# Patient Record
Sex: Female | Born: 1935 | Race: White | Hispanic: No | Marital: Married | Smoking: Former smoker
Health system: Southern US, Community
[De-identification: ages and names within clinical notes are randomized; demographics above are authoritative.]

## PROBLEM LIST (undated history)

## (undated) DIAGNOSIS — M1990 Unspecified osteoarthritis, unspecified site: Secondary | ICD-10-CM

## (undated) DIAGNOSIS — J449 Chronic obstructive pulmonary disease, unspecified: Secondary | ICD-10-CM

## (undated) DIAGNOSIS — I10 Essential (primary) hypertension: Secondary | ICD-10-CM

## (undated) DIAGNOSIS — F329 Major depressive disorder, single episode, unspecified: Secondary | ICD-10-CM

## (undated) DIAGNOSIS — N3281 Overactive bladder: Secondary | ICD-10-CM

## (undated) DIAGNOSIS — F32A Depression, unspecified: Secondary | ICD-10-CM

## (undated) DIAGNOSIS — Z955 Presence of coronary angioplasty implant and graft: Secondary | ICD-10-CM

## (undated) DIAGNOSIS — E7800 Pure hypercholesterolemia, unspecified: Secondary | ICD-10-CM

## (undated) HISTORY — PX: ABDOMINAL AORTIC ANEURYSM REPAIR: SUR1152

## (undated) HISTORY — PX: ABDOMINAL HYSTERECTOMY: SHX81

## (undated) HISTORY — PX: BREAST EXCISIONAL BIOPSY: SUR124

## (undated) HISTORY — PX: NECK SURGERY: SHX720

## (undated) HISTORY — PX: APPENDECTOMY: SHX54

## (undated) HISTORY — PX: TONSILLECTOMY: SUR1361

## (undated) HISTORY — DX: Depression, unspecified: F32.A

## (undated) HISTORY — PX: FOOT SURGERY: SHX648

## (undated) HISTORY — DX: Chronic obstructive pulmonary disease, unspecified: J44.9

## (undated) HISTORY — PX: BREAST SURGERY: SHX581

## (undated) HISTORY — PX: CHOLECYSTECTOMY: SHX55

## (undated) HISTORY — DX: Major depressive disorder, single episode, unspecified: F32.9

---

## 2004-11-10 ENCOUNTER — Ambulatory Visit: Payer: Self-pay | Admitting: *Deleted

## 2004-11-10 ENCOUNTER — Inpatient Hospital Stay (HOSPITAL_COMMUNITY): Admission: AD | Admit: 2004-11-10 | Discharge: 2004-11-11 | Payer: Self-pay | Admitting: *Deleted

## 2013-06-22 DIAGNOSIS — M47812 Spondylosis without myelopathy or radiculopathy, cervical region: Secondary | ICD-10-CM

## 2013-06-22 HISTORY — DX: Spondylosis without myelopathy or radiculopathy, cervical region: M47.812

## 2013-11-18 DIAGNOSIS — M47814 Spondylosis without myelopathy or radiculopathy, thoracic region: Secondary | ICD-10-CM

## 2013-11-18 DIAGNOSIS — M5417 Radiculopathy, lumbosacral region: Secondary | ICD-10-CM

## 2013-11-18 DIAGNOSIS — M5414 Radiculopathy, thoracic region: Secondary | ICD-10-CM | POA: Insufficient documentation

## 2013-11-18 DIAGNOSIS — R519 Headache, unspecified: Secondary | ICD-10-CM | POA: Insufficient documentation

## 2013-11-18 DIAGNOSIS — R51 Headache: Secondary | ICD-10-CM

## 2013-11-18 DIAGNOSIS — IMO0002 Reserved for concepts with insufficient information to code with codable children: Secondary | ICD-10-CM | POA: Insufficient documentation

## 2013-11-18 HISTORY — DX: Spondylosis without myelopathy or radiculopathy, thoracic region: M47.814

## 2013-11-18 HISTORY — DX: Headache, unspecified: R51.9

## 2013-11-18 HISTORY — DX: Radiculopathy, thoracic region: M54.14

## 2014-01-01 DIAGNOSIS — M5417 Radiculopathy, lumbosacral region: Secondary | ICD-10-CM | POA: Insufficient documentation

## 2014-01-01 HISTORY — DX: Radiculopathy, lumbosacral region: M54.17

## 2014-04-30 ENCOUNTER — Other Ambulatory Visit: Payer: Self-pay | Admitting: Neurosurgery

## 2014-04-30 DIAGNOSIS — M4714 Other spondylosis with myelopathy, thoracic region: Secondary | ICD-10-CM

## 2014-04-30 DIAGNOSIS — M4310 Spondylolisthesis, site unspecified: Secondary | ICD-10-CM

## 2014-05-06 ENCOUNTER — Ambulatory Visit
Admission: RE | Admit: 2014-05-06 | Discharge: 2014-05-06 | Disposition: A | Discharge status: Home / Self Care | Payer: Medicare Other | Source: Ambulatory Visit | Attending: Neurosurgery | Admitting: Neurosurgery

## 2014-05-06 VITALS — BP 117/58 | HR 69

## 2014-05-06 DIAGNOSIS — M4310 Spondylolisthesis, site unspecified: Secondary | ICD-10-CM

## 2014-05-06 DIAGNOSIS — M4714 Other spondylosis with myelopathy, thoracic region: Secondary | ICD-10-CM

## 2014-05-06 DIAGNOSIS — M5414 Radiculopathy, thoracic region: Secondary | ICD-10-CM

## 2014-05-06 DIAGNOSIS — M47814 Spondylosis without myelopathy or radiculopathy, thoracic region: Secondary | ICD-10-CM

## 2014-05-06 DIAGNOSIS — M5417 Radiculopathy, lumbosacral region: Secondary | ICD-10-CM

## 2014-05-06 MED ORDER — ONDANSETRON HCL 4 MG/2ML IJ SOLN
4.0000 mg | Freq: Once | INTRAMUSCULAR | Status: AC
  Administered 2014-05-06: 4 mg via INTRAMUSCULAR

## 2014-05-06 MED ORDER — MEPERIDINE HCL 100 MG/ML IJ SOLN
75.0000 mg | Freq: Once | INTRAMUSCULAR | Status: AC
  Administered 2014-05-06: 75 mg via INTRAMUSCULAR

## 2014-05-06 MED ORDER — IOHEXOL 300 MG/ML  SOLN
10.0000 mL | Freq: Once | INTRAMUSCULAR | Status: AC | PRN
  Administered 2014-05-06: 10 mL via INTRATHECAL

## 2014-05-06 MED ORDER — DIAZEPAM 5 MG PO TABS
5.0000 mg | ORAL_TABLET | Freq: Once | ORAL | Status: AC
  Administered 2014-05-06: 5 mg via ORAL

## 2014-05-06 NOTE — Progress Notes (Signed)
Husband at bedside.  Patient resting quietly without complaint (other than a dry mouth) after myelogram.  jkl

## 2014-05-06 NOTE — Discharge Instructions (Signed)
Myelogram Discharge Instructions  1. Go home and rest quietly for the next 24 hours.  It is important to lie flat for the next 24 hours.  Get up only to go to the restroom.  You may lie in the bed or on a couch on your back, your stomach, your left side or your right side.  You may have one pillow under your head.  You may have pillows between your knees while you are on your side or under your knees while you are on your back.  2. DO NOT drive today.  Recline the seat as far back as it will go, while still wearing your seat belt, on the way home.  3. You may get up to go to the bathroom as needed.  You may sit up for 10 minutes to eat.  You may resume your normal diet and medications unless otherwise indicated.  Drink plenty of extra fluids today and tomorrow.  4. The incidence of a spinal headache with nausea and/or vomiting is about 5% (one in 20 patients).  If you develop a headache, lie flat and drink plenty of fluids until the headache goes away.  Caffeinated beverages may be helpful.  If you develop severe nausea and vomiting or a headache that does not go away with flat bed rest, call [phone].  5. You may resume normal activities after your 24 hours of bed rest is over; however, do not exert yourself strongly or do any heavy lifting tomorrow.  6. Call your physician for a follow-up appointment.   You may resume Tramadol on Friday, May 07, 2014 after 8:30a.m.

## 2014-05-06 NOTE — Progress Notes (Signed)
Patient states she has been off Tramadol for at least the past two days.  jkl 

## 2014-05-12 ENCOUNTER — Other Ambulatory Visit: Payer: Self-pay | Admitting: Neurosurgery

## 2014-05-12 DIAGNOSIS — M5126 Other intervertebral disc displacement, lumbar region: Secondary | ICD-10-CM

## 2014-05-17 ENCOUNTER — Other Ambulatory Visit: Payer: Medicare Other

## 2014-05-31 ENCOUNTER — Other Ambulatory Visit: Payer: Self-pay | Admitting: Neurosurgery

## 2014-05-31 ENCOUNTER — Ambulatory Visit
Admission: RE | Admit: 2014-05-31 | Discharge: 2014-05-31 | Disposition: A | Discharge status: Home / Self Care | Payer: Medicare Other | Source: Ambulatory Visit | Attending: Neurosurgery | Admitting: Neurosurgery

## 2014-05-31 DIAGNOSIS — M5126 Other intervertebral disc displacement, lumbar region: Secondary | ICD-10-CM

## 2014-05-31 MED ORDER — IOHEXOL 180 MG/ML  SOLN
1.0000 mL | Freq: Once | INTRAMUSCULAR | Status: AC | PRN
  Administered 2014-05-31: 1 mL via EPIDURAL

## 2014-05-31 MED ORDER — METHYLPREDNISOLONE ACETATE 40 MG/ML INJ SUSP (RADIOLOG
120.0000 mg | Freq: Once | INTRAMUSCULAR | Status: AC
  Administered 2014-05-31: 120 mg via EPIDURAL

## 2014-05-31 NOTE — Discharge Instructions (Signed)

## 2014-11-16 ENCOUNTER — Other Ambulatory Visit: Payer: Self-pay | Admitting: Neurosurgery

## 2014-11-16 DIAGNOSIS — M5126 Other intervertebral disc displacement, lumbar region: Secondary | ICD-10-CM

## 2014-12-14 ENCOUNTER — Other Ambulatory Visit: Payer: Self-pay

## 2014-12-14 ENCOUNTER — Ambulatory Visit
Admission: RE | Admit: 2014-12-14 | Discharge: 2014-12-14 | Disposition: A | Discharge status: Home / Self Care | Payer: Medicare Other | Source: Ambulatory Visit | Attending: Neurosurgery | Admitting: Neurosurgery

## 2014-12-14 DIAGNOSIS — M5126 Other intervertebral disc displacement, lumbar region: Secondary | ICD-10-CM

## 2014-12-14 MED ORDER — IOHEXOL 180 MG/ML  SOLN
1.0000 mL | Freq: Once | INTRAMUSCULAR | Status: AC | PRN
  Administered 2014-12-14: 1 mL via EPIDURAL

## 2014-12-14 MED ORDER — METHYLPREDNISOLONE ACETATE 40 MG/ML INJ SUSP (RADIOLOG
120.0000 mg | Freq: Once | INTRAMUSCULAR | Status: AC
  Administered 2014-12-14: 120 mg via EPIDURAL

## 2014-12-14 NOTE — Discharge Instructions (Signed)

## 2014-12-15 ENCOUNTER — Other Ambulatory Visit: Payer: Self-pay

## 2015-01-05 ENCOUNTER — Other Ambulatory Visit: Payer: Self-pay | Admitting: Neurosurgery

## 2015-01-05 DIAGNOSIS — M4807 Spinal stenosis, lumbosacral region: Secondary | ICD-10-CM

## 2015-01-11 ENCOUNTER — Ambulatory Visit
Admission: RE | Admit: 2015-01-11 | Discharge: 2015-01-11 | Disposition: A | Discharge status: Home / Self Care | Payer: Medicare Other | Source: Ambulatory Visit | Attending: Neurosurgery | Admitting: Neurosurgery

## 2015-01-11 DIAGNOSIS — M4807 Spinal stenosis, lumbosacral region: Secondary | ICD-10-CM

## 2015-01-11 MED ORDER — IOHEXOL 180 MG/ML  SOLN
1.0000 mL | Freq: Once | INTRAMUSCULAR | Status: AC | PRN
  Administered 2015-01-11: 1 mL via EPIDURAL

## 2015-01-11 MED ORDER — METHYLPREDNISOLONE ACETATE 40 MG/ML INJ SUSP (RADIOLOG
120.0000 mg | Freq: Once | INTRAMUSCULAR | Status: AC
  Administered 2015-01-11: 120 mg via EPIDURAL

## 2015-01-11 NOTE — Discharge Instructions (Signed)

## 2015-10-06 DIAGNOSIS — Z862 Personal history of diseases of the blood and blood-forming organs and certain disorders involving the immune mechanism: Secondary | ICD-10-CM | POA: Insufficient documentation

## 2015-10-06 DIAGNOSIS — I739 Peripheral vascular disease, unspecified: Secondary | ICD-10-CM

## 2015-10-06 DIAGNOSIS — D509 Iron deficiency anemia, unspecified: Secondary | ICD-10-CM | POA: Insufficient documentation

## 2015-10-06 DIAGNOSIS — K219 Gastro-esophageal reflux disease without esophagitis: Secondary | ICD-10-CM

## 2015-10-06 DIAGNOSIS — I714 Abdominal aortic aneurysm, without rupture, unspecified: Secondary | ICD-10-CM | POA: Insufficient documentation

## 2015-10-06 DIAGNOSIS — Z95828 Presence of other vascular implants and grafts: Secondary | ICD-10-CM

## 2015-10-06 DIAGNOSIS — J439 Emphysema, unspecified: Secondary | ICD-10-CM

## 2015-10-06 DIAGNOSIS — R0989 Other specified symptoms and signs involving the circulatory and respiratory systems: Secondary | ICD-10-CM | POA: Insufficient documentation

## 2015-10-06 DIAGNOSIS — I2510 Atherosclerotic heart disease of native coronary artery without angina pectoris: Secondary | ICD-10-CM | POA: Insufficient documentation

## 2015-10-06 DIAGNOSIS — I10 Essential (primary) hypertension: Secondary | ICD-10-CM | POA: Insufficient documentation

## 2015-10-06 DIAGNOSIS — G8381 Brown-Sequard syndrome: Secondary | ICD-10-CM | POA: Insufficient documentation

## 2015-10-06 DIAGNOSIS — Z8679 Personal history of other diseases of the circulatory system: Secondary | ICD-10-CM

## 2015-10-06 DIAGNOSIS — E785 Hyperlipidemia, unspecified: Secondary | ICD-10-CM | POA: Insufficient documentation

## 2015-10-06 HISTORY — DX: Abdominal aortic aneurysm, without rupture, unspecified: I71.40

## 2015-10-06 HISTORY — DX: Emphysema, unspecified: J43.9

## 2015-10-06 HISTORY — DX: Personal history of other diseases of the circulatory system: Z86.79

## 2015-10-06 HISTORY — DX: Atherosclerotic heart disease of native coronary artery without angina pectoris: I25.10

## 2015-10-06 HISTORY — DX: Iron deficiency anemia, unspecified: D50.9

## 2015-10-06 HISTORY — DX: Personal history of diseases of the blood and blood-forming organs and certain disorders involving the immune mechanism: Z86.2

## 2015-10-06 HISTORY — DX: Peripheral vascular disease, unspecified: I73.9

## 2015-10-06 HISTORY — DX: Other specified symptoms and signs involving the circulatory and respiratory systems: R09.89

## 2015-10-06 HISTORY — DX: Essential (primary) hypertension: I10

## 2015-10-06 HISTORY — DX: Brown-Sequard syndrome: G83.81

## 2015-10-06 HISTORY — DX: Gastro-esophageal reflux disease without esophagitis: K21.9

## 2015-10-06 HISTORY — DX: Abdominal aortic aneurysm, without rupture: I71.4

## 2015-10-06 HISTORY — DX: Presence of other vascular implants and grafts: Z95.828

## 2015-10-25 DIAGNOSIS — E785 Hyperlipidemia, unspecified: Secondary | ICD-10-CM

## 2015-10-25 HISTORY — DX: Hyperlipidemia, unspecified: E78.5

## 2015-11-03 ENCOUNTER — Encounter (HOSPITAL_COMMUNITY): Payer: Self-pay | Admitting: *Deleted

## 2015-11-03 ENCOUNTER — Emergency Department (HOSPITAL_COMMUNITY)
Admission: EM | Admit: 2015-11-03 | Discharge: 2015-11-03 | Disposition: A | Discharge status: Home / Self Care | Payer: Medicare PPO | Attending: Emergency Medicine | Admitting: Emergency Medicine

## 2015-11-03 DIAGNOSIS — M25512 Pain in left shoulder: Secondary | ICD-10-CM | POA: Diagnosis not present

## 2015-11-03 DIAGNOSIS — M546 Pain in thoracic spine: Secondary | ICD-10-CM | POA: Diagnosis not present

## 2015-11-03 DIAGNOSIS — Z880 Allergy status to penicillin: Secondary | ICD-10-CM | POA: Insufficient documentation

## 2015-11-03 DIAGNOSIS — Z7982 Long term (current) use of aspirin: Secondary | ICD-10-CM | POA: Diagnosis not present

## 2015-11-03 DIAGNOSIS — Z79899 Other long term (current) drug therapy: Secondary | ICD-10-CM | POA: Diagnosis not present

## 2015-11-03 DIAGNOSIS — I10 Essential (primary) hypertension: Secondary | ICD-10-CM | POA: Insufficient documentation

## 2015-11-03 DIAGNOSIS — E7800 Pure hypercholesterolemia, unspecified: Secondary | ICD-10-CM | POA: Diagnosis not present

## 2015-11-03 DIAGNOSIS — Z87448 Personal history of other diseases of urinary system: Secondary | ICD-10-CM | POA: Diagnosis not present

## 2015-11-03 DIAGNOSIS — Z955 Presence of coronary angioplasty implant and graft: Secondary | ICD-10-CM | POA: Diagnosis not present

## 2015-11-03 DIAGNOSIS — Z87891 Personal history of nicotine dependence: Secondary | ICD-10-CM | POA: Diagnosis not present

## 2015-11-03 DIAGNOSIS — M542 Cervicalgia: Secondary | ICD-10-CM

## 2015-11-03 DIAGNOSIS — M1990 Unspecified osteoarthritis, unspecified site: Secondary | ICD-10-CM | POA: Diagnosis not present

## 2015-11-03 HISTORY — DX: Unspecified osteoarthritis, unspecified site: M19.90

## 2015-11-03 HISTORY — DX: Essential (primary) hypertension: I10

## 2015-11-03 HISTORY — DX: Overactive bladder: N32.81

## 2015-11-03 HISTORY — DX: Presence of coronary angioplasty implant and graft: Z95.5

## 2015-11-03 HISTORY — DX: Pure hypercholesterolemia, unspecified: E78.00

## 2015-11-03 LAB — I-STAT TROPONIN, ED: Troponin i, poc: 0 ng/mL (ref 0.00–0.08)

## 2015-11-03 MED ORDER — FENTANYL CITRATE (PF) 100 MCG/2ML IJ SOLN
50.0000 ug | Freq: Once | INTRAMUSCULAR | Status: AC
  Administered 2015-11-03: 50 ug via INTRAMUSCULAR
  Filled 2015-11-03: qty 2

## 2015-11-03 NOTE — ED Notes (Signed)
Pt c/o L shoulder pain onset x 5 days denies injury, pt moves L arm with pain, +PS, no swelling noted, A&O x4

## 2015-11-03 NOTE — ED Provider Notes (Signed)
CSN: [id number]     Arrival date & time 11/03/15  1322 History  By signing my name below, I, Doreatha Martinva Mathews, attest that this documentation has been prepared under the direction and in the presence of Newell RubbermaidJeffrey Kate Larock, PA-C. Electronically Signed: Doreatha MartinEva Mathews, ED Scribe. 11/03/2015. 3:25 PM.     Chief Complaint  Patient presents with  . Shoulder Pain   The history is provided by the patient. No language interpreter was used.   HPI Comments:   Kristi BonBarbara P Gelpi is a 80 y.o. female with h/o HTN, cardiac stent, bulging disc on PRN 5-325 mg hydrocodone-acetaminophen who presents to the Emergency Department complaining of moderate, gradually worsening left shoulder pain onset 4 days ago. Pt notes that her pain began in her neck and has progressed to her shoulder and left upper back. She reports she took 5-325 hydrocodone-acetaminophen at 9am today with no relief of pain. Pt notes that per pain is exacerbated by movement and pressure, unaffected by inspiration and expiration. She notes h/o similar pain of the right shoulder that did not extend as far down her shoulder and back. Pt states that she was operated on by Dr. Deneen HartsHussey with relief of the pain of her right shoulder. Pt states her current pain does not feel similar to prior MI. Pt was diagnosed with Pneumonia 4 days ago by her PCP and notes that her cough has been improving. No h/o CA. No recent significant weight loss. Pt notes that most recently, she has been followed by Dr. Glee ArvinKram with orthopedic surgery for her back pain.  She denies CP, SOB, abdominal pain, pain radiation down her arm.     Past Medical History  Diagnosis Date  . Hypertension   . Arthritis   . Overactive bladder   . Hypercholesteremia   . Hx of heart artery stent    Past Surgical History  Procedure Laterality Date  . Abdominal hysterectomy    . Cholecystectomy    . Neck surgery    . Abdominal aortic aneurysm repair    . Foot surgery Left    No family history on  file. Social History  Substance Use Topics  . Smoking status: Former Smoker -- 1.00 packs/day for 40 years    Types: Cigarettes    Quit date: 12/13/2005  . Smokeless tobacco: Never Used  . Alcohol Use: No   OB History    No data available     Review of Systems  All other systems reviewed and are negative.  Allergies  Amoxicillin and Sulfa antibiotics  Home Medications   Prior to Admission medications   Medication Sig Start Date End Date Taking? Authorizing Provider  amLODipine (NORVASC) 5 MG tablet Frequency:   Dosage:0   MG  Instructions:  Note:1 tablet po daily. 06/22/13   Historical Provider, MD  aspirin EC 325 MG tablet Frequency:   Dosage:0   MG  Instructions:  Note:1 tablet po daily. 06/22/13   Historical Provider, MD  atorvastatin (LIPITOR) 80 MG tablet 1 mg. 06/22/13   Historical Provider, MD  baclofen (LIORESAL) 10 MG tablet Frequency:   Dosage:0   MG  Instructions:  Note:1 tablet po q 12hrs as needed for major pain. 06/22/13   Historical Provider, MD  Calcium Carb-Cholecalciferol (SM CALCIUM/VITAMIN D) 600-800 MG-UNIT TABS Frequency:   Dosage:0     Instructions:  Note:1 tablet po BID. 06/22/13   Historical Provider, MD  Cholecalciferol (D 2000) 2000 UNITS TABS Frequency:   Dosage:0   UNIT  Instructions:  Note:1  tablet po daily. 06/22/13   Historical Provider, MD  esomeprazole (NEXIUM) 40 MG capsule Frequency:   Dosage:0   MG  Instructions:  Note:TAKE ONE CAPSULE BY MOUTH ONCE DAILY 06/22/13   Historical Provider, MD  ezetimibe (ZETIA) 10 MG tablet Frequency:   Dosage:0   MG  Instructions:  Note:1 tablet po daily. 06/22/13   Historical Provider, MD  Glucosamine 750 MG TABS Frequency:   Dosage:0   MG  Instructions:  Note:2 tablets po daily. 06/22/13   Historical Provider, MD  metoprolol (LOPRESSOR) 50 MG tablet Frequency:   Dosage:0   MG  Instructions:  Note:1 tablet po daily. 06/22/13   Historical Provider, MD  pregabalin (LYRICA) 50 MG capsule 50 mg. 11/18/13   Historical  Provider, MD  ranitidine (ZANTAC) 300 MG capsule Frequency:   Dosage:0.0     Instructions:  Note: 06/22/13   Historical Provider, MD  tiotropium (SPIRIVA HANDIHALER) 18 MCG inhalation capsule Frequency:   Dosage:0   MCG  Instructions:  Note:USE 1 CAPSULE IN HANDIHALER ONCE DAILY 06/22/13   Historical Provider, MD  traMADol (ULTRAM) 50 MG tablet 4 mg. 09/15/13   Historical Provider, MD   BP 130/60 mmHg  Pulse 83  Temp(Src) 98.1 F (36.7 C) (Oral)  Resp 18  Ht  (1.575 m)  Wt 58.174 kg  BMI 23.45 kg/m2  SpO2 98% Physical Exam  Constitutional: She is oriented to person, place, and time. She appears well-developed and well-nourished.  HENT:  Head: Normocephalic and atraumatic.  Eyes: Conjunctivae and EOM are normal. Pupils are equal, round, and reactive to light.  Neck: Normal range of motion. Neck supple.  Cardiovascular: Normal rate.   Pulmonary/Chest: Effort normal. No respiratory distress.  Abdominal: She exhibits no distension.  Musculoskeletal: Normal range of motion. She exhibits tenderness.  External inspection surgical scar noted to the lower neck and upper back. TTP of the lateral paraspinal muscles at the cervical and left thoracic vertebrae. No obvious swelling, redness, rash. Tenderness extends down into the rhomboids. Grip strength 5/5. Radial pulse 2+. Pain with ROM of the shoulder. No TTP of the anterior shoulder or deltoid.   Neurological: She is alert and oriented to person, place, and time.  Skin: Skin is warm and dry.  Psychiatric: She has a normal mood and affect. Her behavior is normal.  Nursing note and vitals reviewed.   ED Course  Procedures (including critical care time) DIAGNOSTIC STUDIES: Oxygen Saturation is 97% on RA, normal by my interpretation.    COORDINATION OF CARE: 3:12 PM Discussed treatment plan with pt at bedside which includes follow up with orthopedic surgery and pt agreed to plan.   Labs Review Labs Reviewed  Rosezena Sensor, ED     Imaging Review No results found. I have personally reviewed and evaluated these images and lab results as part of my medical decision-making.   EKG Interpretation   Date/Time:  Thursday November 03 2015 14:49:26 EST Ventricular Rate:  87 PR Interval:  152 QRS Duration: 76 QT Interval:  400 QTC Calculation: 481 R Axis:   -9 Text Interpretation:  Normal sinus rhythm Cannot rule out Anterior infarct  , age undetermined Abnormal ECG No acute changes Confirmed by Bebe Shaggy   MD, DONALD ([id number]) on 11/03/2015 3:45:55 PM      MDM   Final diagnoses:  Neck pain    Labs: I-Stat troponin   Imaging:  Consults:  Therapeutics: Fentanyl   Discharge Meds:   Assessment/Plan: Pt presents to the ED with left shoulder pain  for 4 days. Given her recent Pneumonia dx 4 days ago and cardiac history, EKG and troponin were obtained- both negative.Patient has no chest pain, shortness of breath, diaphoresis, abdominal pain, very low suspicion for ACS, dissection, PE, or any other life-threatening etiology. Patient has a history of radiculopathy in the past, she is tender to palpation of the muscles of the neck and back. She has no distal neurological deficits.  Pain was managed in the ED with 50 mg Fentanyl. Due to patient's age recommendation for plain films of the cervical and thoracic spine to rule out malignancy. Patient's pain was managed and requesting discharge home. Pt will be sent home with instructions to continue pain management at home. Conservative therapies discussed and recommended. Pt advised to follow up with neurosurgery for further treatment given her history of similar pain on the right side that required surgery. Pt appears stable for discharge at this time. Return precautions discussed and outlined in discharge paperwork. Pt is agreeable to plan.   Patient care was shared with Dr. Bebe Shaggy, he agreed to assessment and plan after personally evaluating the patient.    I personally  performed the services described in this documentation, which was scribed in my presence. The recorded information has been reviewed and is accurate.    Eyvonne Mechanic, PA-C 11/03/15 1613  Zadie Rhine, MD 11/04/15 [phone]

## 2015-11-03 NOTE — ED Notes (Signed)
Per PA, patient to have EKG and troponin drawn in triage prior to coming to [facility].

## 2015-11-03 NOTE — ED Notes (Signed)
Patient able to ambulate independently  

## 2015-11-03 NOTE — ED Notes (Signed)
Spoke with triage, Toniann FailWendy, RN per PA request for EKG/ possible labs before bringing patient to [facility].   Hedges, PA, spoke with Toniann FailWendy RN also and agreed to go see patient first, and will let triage know.

## 2015-11-03 NOTE — Discharge Instructions (Signed)
Please follow-up with neurosurgeon for further evaluation and management. Please return immediately if any worsening signs or symptoms present.

## 2017-03-31 DIAGNOSIS — F3342 Major depressive disorder, recurrent, in full remission: Secondary | ICD-10-CM | POA: Insufficient documentation

## 2017-03-31 HISTORY — DX: Major depressive disorder, recurrent, in full remission: F33.42

## 2017-05-28 ENCOUNTER — Telehealth: Payer: Self-pay

## 2017-05-28 ENCOUNTER — Other Ambulatory Visit: Payer: Self-pay

## 2017-05-28 DIAGNOSIS — E785 Hyperlipidemia, unspecified: Secondary | ICD-10-CM

## 2017-05-28 MED ORDER — ATORVASTATIN CALCIUM 80 MG PO TABS: 80.0000 mg | ORAL_TABLET | Freq: Every day | ORAL | 0 refills | Status: DC

## 2017-05-28 NOTE — Telephone Encounter (Signed)
Spoke with patient regarding her atorvastatin refill and the need for an office visit. She agreed that she would make an appointment soon and understood that the med would only be provided for 1 month. Craige Cotta

## 2017-07-25 ENCOUNTER — Other Ambulatory Visit: Payer: Self-pay | Admitting: Cardiology

## 2017-07-25 DIAGNOSIS — E785 Hyperlipidemia, unspecified: Secondary | ICD-10-CM

## 2017-08-16 ENCOUNTER — Other Ambulatory Visit: Payer: Self-pay | Admitting: Cardiology

## 2017-08-16 DIAGNOSIS — E785 Hyperlipidemia, unspecified: Secondary | ICD-10-CM

## 2017-08-21 ENCOUNTER — Other Ambulatory Visit: Payer: Self-pay

## 2017-08-21 DIAGNOSIS — E785 Hyperlipidemia, unspecified: Secondary | ICD-10-CM

## 2017-08-21 MED ORDER — ATORVASTATIN CALCIUM 80 MG PO TABS: 80.0000 mg | ORAL_TABLET | Freq: Every day | ORAL | 1 refills | Status: DC

## 2017-08-26 ENCOUNTER — Ambulatory Visit (INDEPENDENT_AMBULATORY_CARE_PROVIDER_SITE_OTHER): Payer: Medicare HMO | Admitting: Cardiology

## 2017-08-26 ENCOUNTER — Encounter: Payer: Self-pay | Admitting: Cardiology

## 2017-08-26 VITALS — BP 110/58 | HR 90 | Ht 62.0 in | Wt 145.0 lb

## 2017-08-26 DIAGNOSIS — Z95828 Presence of other vascular implants and grafts: Secondary | ICD-10-CM

## 2017-08-26 DIAGNOSIS — I6523 Occlusion and stenosis of bilateral carotid arteries: Secondary | ICD-10-CM | POA: Diagnosis not present

## 2017-08-26 DIAGNOSIS — I10 Essential (primary) hypertension: Secondary | ICD-10-CM | POA: Diagnosis not present

## 2017-08-26 DIAGNOSIS — I2510 Atherosclerotic heart disease of native coronary artery without angina pectoris: Secondary | ICD-10-CM

## 2017-08-26 DIAGNOSIS — I714 Abdominal aortic aneurysm, without rupture, unspecified: Secondary | ICD-10-CM

## 2017-08-26 DIAGNOSIS — E785 Hyperlipidemia, unspecified: Secondary | ICD-10-CM

## 2017-08-26 MED ORDER — NITROGLYCERIN 0.4 MG SL SUBL: 0.4000 mg | SUBLINGUAL_TABLET | SUBLINGUAL | 6 refills | Status: DC | PRN

## 2017-08-26 NOTE — Progress Notes (Signed)
Cardiology Office Note:    Date:  08/26/2017   ID:  Kristi Olson, DOB 08/31/1935, MRN [id number]  PCP:  Raynelle JanSpry, Heather M., MD  Cardiologist:  Garwin Brothersajan R Doniven Vanpatten, MD   Referring MD: No ref. provider found    ASSESSMENT:    1. Abdominal aortic aneurysm (AAA) without rupture (HCC)   2. Benign essential HTN   3. CAD in native artery   4. Carotid stenosis, asymptomatic, bilateral   5. Dyslipidemia   6. H/O endovascular stent graft for abdominal aortic aneurysm    PLAN:    In order of problems listed above:  1. Secondary prevention stressed with the patient.  Importance of compliance with diet and medications stressed and she vocalized understanding. 2. EKG done today reveals sinus rhythm and nonspecific ST-T changes and was unremarkable. 3. Her blood pressure stable and diet was discussed for dyslipidemia and lipids are followed by her primary care physician. 4. Importance of regular exercise stressed and she will be seen in follow-up appointment in 6 months or earlier if she has any concerns.  She request nitroglycerin prescription and we explained his protocol i sent out that prescription for her.   Medication Adjustments/Labs and Tests Ordered: Current medicines are reviewed at length with the patient today.  Concerns regarding medicines are outlined above.  Orders Placed This Encounter  Procedures  . EKG 12-Lead   Meds ordered this encounter  Medications  . nitroGLYCERIN (NITROSTAT) 0.4 MG SL tablet    Sig: Place 1 tablet (0.4 mg total) under the tongue as needed for chest pain.    Dispense:  11 tablet    Refill:  6     Chief Complaint  Patient presents with  . Follow-up     History of Present Illness:    Kristi BonBarbara P Olson is a 81 y.o. female.  This patient has been under my care in my previous practice.  He is here now to transfer his care and be established with my current practice.  She has known coronary artery disease.  She also has essential hypertension and  takes care of activities of daily living.  Today she mentions to me that she had significant GI upset and throwing up.  The patient is here for follow-up.  She denies any chest pain orthopnea or PND.  She takes care of activities of daily living.  At the time of my evaluation, the patient is alert awake oriented and in no distress.  Past Medical History:  Diagnosis Date  . Arthritis   . COPD (chronic obstructive pulmonary disease) (HCC)   . Depression   . Hx of heart artery stent   . Hypercholesteremia   . Hypertension   . Overactive bladder     Past Surgical History:  Procedure Laterality Date  . ABDOMINAL AORTIC ANEURYSM REPAIR    . ABDOMINAL HYSTERECTOMY    . APPENDECTOMY    . BREAST EXCISIONAL BIOPSY    . BREAST SURGERY    . CHOLECYSTECTOMY    . FOOT SURGERY Left   . NECK SURGERY    . TONSILLECTOMY      Current Medications: Current Meds  Medication Sig  . amLODipine (NORVASC) 5 MG tablet Frequency:   Dosage:0   MG  Instructions:  Note:1 tablet po daily.  Marland Kitchen. aspirin EC 81 MG tablet Take 81 mg by mouth daily. Frequency:   Dosage:0   MG  Instructions:  Note:1 tablet po daily.  Marland Kitchen. atorvastatin (LIPITOR) 80 MG tablet Take 1 tablet (80  mg total) by mouth daily.  . Calcium Carb-Cholecalciferol (CALCIUM 1000 + D) 1000-800 MG-UNIT TABS Take 2 tablets by mouth daily.  . Cholecalciferol (VITAMIN D3) 2000 units capsule Take 2 capsules by mouth daily.  Marland Kitchen ezetimibe (ZETIA) 10 MG tablet Take 10 mg by mouth daily.  . ferrous sulfate 325 (65 FE) MG EC tablet Take 1 tablet by mouth daily.  Marland Kitchen HYDROcodone-acetaminophen (NORCO/VICODIN) 5-325 MG tablet Take 1 tablet by mouth as needed.  . linaclotide (LINZESS) 145 MCG CAPS capsule Take 1 capsule by mouth daily.  . metoprolol succinate (TOPROL-XL) 50 MG 24 hr tablet Take 1 tablet by mouth daily.  . Multiple Vitamins-Minerals (MULTIVITAMIN ADULT PO) Take 1 tablet by mouth daily.  . nitroGLYCERIN (NITROSTAT) 0.4 MG SL tablet Place 1 tablet (0.4 mg  total) under the tongue as needed for chest pain.  Marland Kitchen Omeprazole Magnesium 20.6 (20 Base) MG CPDR Take 1 capsule by mouth 2 (two) times daily.  . pregabalin (LYRICA) 75 MG capsule Take 75 mg by mouth daily.   . ranitidine (ZANTAC) 300 MG capsule Frequency:   Dosage:0.0     Instructions:  Note:  . [DISCONTINUED] nitroGLYCERIN (NITROSTAT) 0.4 MG SL tablet Place 1 tablet under the tongue as needed for chest pain.     Allergies:   Amoxicillin and Sulfa antibiotics   Social History   Socioeconomic History  . Marital status: Married    Spouse name: None  . Number of children: None  . Years of education: None  . Highest education level: None  Social Needs  . Financial resource strain: None  . Food insecurity - worry: None  . Food insecurity - inability: None  . Transportation needs - medical: None  . Transportation needs - non-medical: None  Occupational History  . None  Tobacco Use  . Smoking status: Former Smoker    Packs/day: 1.00    Years: 40.00    Pack years: 40.00    Types: Cigarettes    Last attempt to quit: 12/13/2005    Years since quitting: 11.7  . Smokeless tobacco: Never Used  Substance and Sexual Activity  . Alcohol use: No    Alcohol/week: 0.0 oz  . Drug use: No  . Sexual activity: None  Other Topics Concern  . None  Social History Narrative  . None     Family History: The patient's family history includes Breast cancer in her sister.  ROS:   Please see the history of present illness.    All other systems reviewed and are negative.  EKGs/Labs/Other Studies Reviewed:    The following studies were reviewed today: I reviewed records from primary care physician's office as in previous office notes.  Her abdominal aneurysm and intervention and carotid issues are followed by vascular surgery whom she follows with on a regular basis.  She does admit that she recently had blood work by her primary care physician and we had the process of obtaining them.   Recent  Labs: No results found for requested labs within last 8760 hours.  Recent Lipid Panel No results found for: CHOL, TRIG, HDL, CHOLHDL, VLDL, LDLCALC, LDLDIRECT  Physical Exam:    VS:  BP (!) 110/58 (BP Location: Right Arm, Patient Position: Sitting, Cuff Size: Normal)   Pulse 90   Ht 5\' 2"  (1.575 m)   Wt 145 lb (65.8 kg)   SpO2 92%   BMI 26.52 kg/m     Wt Readings from Last 3 Encounters:  08/26/17 145 lb (65.8 kg)  11/03/15 128 lb 4 oz (58.2 kg)  12/14/14 134 lb (60.8 kg)     GEN: Patient is in no acute distress HEENT: Normal NECK: No JVD; No carotid bruits LYMPHATICS: No lymphadenopathy CARDIAC: Hear sounds regular, 2/6 systolic murmur at the apex. RESPIRATORY:  Clear to auscultation without rales, wheezing or rhonchi  ABDOMEN: Soft, non-tender, non-distended MUSCULOSKELETAL:  No edema; No deformity  SKIN: Warm and dry NEUROLOGIC:  Alert and oriented x 3 PSYCHIATRIC:  Normal affect   Signed, Garwin Brothersajan R Niomi Valent, MD  08/26/2017 10:42 AM    [facility]

## 2017-08-26 NOTE — Patient Instructions (Signed)
Medication Instructions:  Your physician recommends that you continue on your current medications as directed. Please refer to the Current Medication list given to you today.  Nitroglycerin refill sent  Labwork: None  Testing/Procedures: None  Follow-Up: Your physician recommends that you schedule a follow-up appointment in: 6 months  Any Other Special Instructions Will Be Listed Below (If Applicable).     If you need a refill on your cardiac medications before your next appointment, please call your pharmacy.   [facility]  Garey HamAshley A, RN, BSN

## 2017-08-26 NOTE — Progress Notes (Deleted)
Cardiology Office Note:    Date:  08/26/2017   ID:  Kristi Olson, DOB 07/29/1936, MRN [id number]  PCP:  Raynelle JanSpry, Heather M., MD  Cardiologist:  Garwin Brothersajan R Revankar, MD   Referring MD: No ref. provider found    ASSESSMENT:    1. Abdominal aortic aneurysm (AAA) without rupture (HCC)   2. Benign essential HTN   3. CAD in native artery   4. Carotid stenosis, asymptomatic, bilateral   5. Dyslipidemia   6. H/O endovascular stent graft for abdominal aortic aneurysm    PLAN:    In order of problems listed above:  1. ***   Medication Adjustments/Labs and Tests Ordered: Current medicines are reviewed at length with the patient today.  Concerns regarding medicines are outlined above.  No orders of the defined types were placed in this encounter.  No orders of the defined types were placed in this encounter.    Chief Complaint  Patient presents with  . Follow-up     History of Present Illness:    Kristi Olson is a 81 y.o. female ***  Past Medical History:  Diagnosis Date  . Arthritis   . COPD (chronic obstructive pulmonary disease) (HCC)   . Depression   . Hx of heart artery stent   . Hypercholesteremia   . Hypertension   . Overactive bladder     Past Surgical History:  Procedure Laterality Date  . ABDOMINAL AORTIC ANEURYSM REPAIR    . ABDOMINAL HYSTERECTOMY    . APPENDECTOMY    . BREAST EXCISIONAL BIOPSY    . BREAST SURGERY    . CHOLECYSTECTOMY    . FOOT SURGERY Left   . NECK SURGERY    . TONSILLECTOMY      Current Medications: Current Meds  Medication Sig  . amLODipine (NORVASC) 5 MG tablet Frequency:   Dosage:0   MG  Instructions:  Note:1 tablet po daily.  Marland Kitchen. aspirin EC 81 MG tablet Take 81 mg by mouth daily. Frequency:   Dosage:0   MG  Instructions:  Note:1 tablet po daily.  Marland Kitchen. atorvastatin (LIPITOR) 80 MG tablet Take 1 tablet (80 mg total) by mouth daily.  . Calcium Carb-Cholecalciferol (CALCIUM 1000 + D) 1000-800 MG-UNIT TABS Take 2 tablets by  mouth daily.  . Cholecalciferol (VITAMIN D3) 2000 units capsule Take 2 capsules by mouth daily.  Marland Kitchen. ezetimibe (ZETIA) 10 MG tablet Take 10 mg by mouth daily.  . ferrous sulfate 325 (65 FE) MG EC tablet Take 1 tablet by mouth daily.  Marland Kitchen. HYDROcodone-acetaminophen (NORCO/VICODIN) 5-325 MG tablet Take 1 tablet by mouth as needed.  . linaclotide (LINZESS) 145 MCG CAPS capsule Take 1 capsule by mouth daily.  . metoprolol succinate (TOPROL-XL) 50 MG 24 hr tablet Take 1 tablet by mouth daily.  . Multiple Vitamins-Minerals (MULTIVITAMIN ADULT PO) Take 1 tablet by mouth daily.  . nitroGLYCERIN (NITROSTAT) 0.4 MG SL tablet Place 1 tablet under the tongue as needed for chest pain.  Marland Kitchen. Omeprazole Magnesium 20.6 (20 Base) MG CPDR Take 1 capsule by mouth 2 (two) times daily.  . pregabalin (LYRICA) 75 MG capsule Take 75 mg by mouth daily.   . ranitidine (ZANTAC) 300 MG capsule Frequency:   Dosage:0.0     Instructions:  Note:     Allergies:   Amoxicillin and Sulfa antibiotics   Social History   Socioeconomic History  . Marital status: Married    Spouse name: None  . Number of children: None  . Years of education: None  .  Highest education level: None  Social Needs  . Financial resource strain: None  . Food insecurity - worry: None  . Food insecurity - inability: None  . Transportation needs - medical: None  . Transportation needs - non-medical: None  Occupational History  . None  Tobacco Use  . Smoking status: Former Smoker    Packs/day: 1.00    Years: 40.00    Pack years: 40.00    Types: Cigarettes    Last attempt to quit: 12/13/2005    Years since quitting: 11.7  . Smokeless tobacco: Never Used  Substance and Sexual Activity  . Alcohol use: No    Alcohol/week: 0.0 oz  . Drug use: No  . Sexual activity: None  Other Topics Concern  . None  Social History Narrative  . None     Family History: The patient's family history includes Breast cancer in her sister.  ROS:   Please see the  history of present illness.    All other systems reviewed and are negative.  EKGs/Labs/Other Studies Reviewed:    The following studies were reviewed today: ***   Recent Labs: No results found for requested labs within last 8760 hours.  Recent Lipid Panel No results found for: CHOL, TRIG, HDL, CHOLHDL, VLDL, LDLCALC, LDLDIRECT  Physical Exam:    VS:  BP (!) 110/58 (BP Location: Right Arm, Patient Position: Sitting, Cuff Size: Normal)   Pulse 90   Ht 5\' 2"  (1.575 m)   Wt 145 lb (65.8 kg)   SpO2 92%   BMI 26.52 kg/m     Wt Readings from Last 3 Encounters:  08/26/17 145 lb (65.8 kg)  11/03/15 128 lb 4 oz (58.2 kg)  12/14/14 134 lb (60.8 kg)     GEN: Patient is in no acute distress HEENT: Normal NECK: No JVD; No carotid bruits LYMPHATICS: No lymphadenopathy CARDIAC: Hear sounds regular, 2/6 systolic murmur at the apex. RESPIRATORY:  Clear to auscultation without rales, wheezing or rhonchi  ABDOMEN: Soft, non-tender, non-distended MUSCULOSKELETAL:  No edema; No deformity  SKIN: Warm and dry NEUROLOGIC:  Alert and oriented x 3 PSYCHIATRIC:  Normal affect   Signed, Garwin Brothersajan R Revankar, MD  08/26/2017 10:17 AM    [facility]

## 2018-04-03 ENCOUNTER — Other Ambulatory Visit: Payer: Self-pay | Admitting: Cardiology

## 2018-04-03 DIAGNOSIS — E785 Hyperlipidemia, unspecified: Secondary | ICD-10-CM

## 2018-04-24 ENCOUNTER — Encounter: Payer: Self-pay | Admitting: Cardiology

## 2018-04-24 ENCOUNTER — Ambulatory Visit (INDEPENDENT_AMBULATORY_CARE_PROVIDER_SITE_OTHER): Payer: Medicare HMO | Admitting: Cardiology

## 2018-04-24 VITALS — BP 140/62 | HR 67 | Ht 62.0 in | Wt 145.4 lb

## 2018-04-24 DIAGNOSIS — E785 Hyperlipidemia, unspecified: Secondary | ICD-10-CM | POA: Diagnosis not present

## 2018-04-24 DIAGNOSIS — Z95828 Presence of other vascular implants and grafts: Secondary | ICD-10-CM | POA: Diagnosis not present

## 2018-04-24 DIAGNOSIS — I2510 Atherosclerotic heart disease of native coronary artery without angina pectoris: Secondary | ICD-10-CM

## 2018-04-24 DIAGNOSIS — I10 Essential (primary) hypertension: Secondary | ICD-10-CM | POA: Diagnosis not present

## 2018-04-24 HISTORY — DX: Atherosclerotic heart disease of native coronary artery without angina pectoris: I25.10

## 2018-04-24 LAB — LIPID PANEL
CHOL/HDL RATIO: 3.5 ratio (ref 0.0–4.4)
Cholesterol, Total: 172 mg/dL (ref 100–199)
HDL: 49 mg/dL (ref >39)
LDL Calculated: 73 mg/dL (ref 0–99)
TRIGLYCERIDES: 251 mg/dL — AB (ref 0–149)
VLDL Cholesterol Cal: 50 mg/dL — ABNORMAL HIGH (ref 5–40)

## 2018-04-24 LAB — CBC WITH DIFFERENTIAL/PLATELET
BASOS: 0 %
Basophils Absolute: 0 10*3/uL (ref 0.0–0.2)
EOS (ABSOLUTE): 0.4 10*3/uL (ref 0.0–0.4)
EOS: 6 %
HEMOGLOBIN: 12.3 g/dL (ref 11.1–15.9)
Hematocrit: 37.3 % (ref 34.0–46.6)
Immature Grans (Abs): 0 10*3/uL (ref 0.0–0.1)
Immature Granulocytes: 0 %
LYMPHS ABS: 1.4 10*3/uL (ref 0.7–3.1)
Lymphs: 21 %
MCH: 29.4 pg (ref 26.6–33.0)
MCHC: 33 g/dL (ref 31.5–35.7)
MCV: 89 fL (ref 79–97)
MONOCYTES: 9 %
MONOS ABS: 0.6 10*3/uL (ref 0.1–0.9)
Neutrophils Absolute: 4.4 10*3/uL (ref 1.4–7.0)
Neutrophils: 64 %
Platelets: 194 10*3/uL (ref 150–450)
RBC: 4.19 x10E6/uL (ref 3.77–5.28)
RDW: 15.1 % (ref 12.3–15.4)
WBC: 6.8 10*3/uL (ref 3.4–10.8)

## 2018-04-24 LAB — BASIC METABOLIC PANEL
BUN / CREAT RATIO: 15 (ref 12–28)
BUN: 18 mg/dL (ref 8–27)
CO2: 22 mmol/L (ref 20–29)
CREATININE: 1.19 mg/dL — AB (ref 0.57–1.00)
Calcium: 9.2 mg/dL (ref 8.7–10.3)
Chloride: 105 mmol/L (ref 96–106)
GFR, EST AFRICAN AMERICAN: 50 mL/min/{1.73_m2} — AB (ref >59)
GFR, EST NON AFRICAN AMERICAN: 43 mL/min/{1.73_m2} — AB (ref >59)
GLUCOSE: 124 mg/dL — AB (ref 65–99)
Potassium: 4.6 mmol/L (ref 3.5–5.2)
SODIUM: 143 mmol/L (ref 134–144)

## 2018-04-24 LAB — HEPATIC FUNCTION PANEL
ALT: 29 IU/L (ref 0–32)
AST: 15 IU/L (ref 0–40)
Albumin: 4.4 g/dL (ref 3.5–4.7)
Alkaline Phosphatase: 118 IU/L — ABNORMAL HIGH (ref 39–117)
BILIRUBIN, DIRECT: 0.11 mg/dL (ref 0.00–0.40)
Bilirubin Total: 0.3 mg/dL (ref 0.0–1.2)
TOTAL PROTEIN: 6.6 g/dL (ref 6.0–8.5)

## 2018-04-24 LAB — TSH: TSH: 0.998 u[IU]/mL (ref 0.450–4.500)

## 2018-04-24 NOTE — Progress Notes (Signed)
Cardiology Office Note:    Date:  04/24/2018   ID:  Kristi Olson, DOB [birth year], MRN [id number]  PCP:  Raynelle Jan., MD  Cardiologist:  Garwin Brothers, MD   Referring MD: Raynelle Jan., MD    ASSESSMENT:    1. Coronary artery disease involving native coronary artery of native heart without angina pectoris   2. Benign essential HTN   3. H/O endovascular stent graft for abdominal aortic aneurysm   4. Dyslipidemia    PLAN:    In order of problems listed above:  1. Secondary prevention stressed to the patient.  Importance of compliance with diet and medications stressed and she vocalized understanding.  Her blood pressure is stable.  Diet was discussed for dyslipidemia.  Patient vocalized understanding.  I told her to be cautious about not gaining weight. 2. She will have blood work today including fasting lipids. 3. Patient will be seen in follow-up appointment in 6 months or earlier if the patient has any concerns    Medication Adjustments/Labs and Tests Ordered: Current medicines are reviewed at length with the patient today.  Concerns regarding medicines are outlined above.  No orders of the defined types were placed in this encounter.  No orders of the defined types were placed in this encounter.    No chief complaint on file.    History of Present Illness:    Kristi Olson is a 82 y.o. female.  Patient has known coronary artery disease.  She is undergone abdominal aortic aneurysm repair and recently her vascular surgeon did an ultrasound for follow-up.  She denies any problems at this time and takes care of activities of daily living.  No chest pain orthopnea or PND.  At the time of my evaluation, the patient is alert awake oriented and in no distress.  She leads a sedentary lifestyle because of orthopedic issues and she cannot walk much.  Past Medical History:  Diagnosis Date  . Arthritis   . COPD (chronic obstructive pulmonary disease) (HCC)   .  Depression   . Hx of heart artery stent   . Hypercholesteremia   . Hypertension   . Overactive bladder     Past Surgical History:  Procedure Laterality Date  . ABDOMINAL AORTIC ANEURYSM REPAIR    . ABDOMINAL HYSTERECTOMY    . APPENDECTOMY    . BREAST EXCISIONAL BIOPSY    . BREAST SURGERY    . CHOLECYSTECTOMY    . FOOT SURGERY Left   . NECK SURGERY    . TONSILLECTOMY      Current Medications: Current Meds  Medication Sig  . amLODipine (NORVASC) 5 MG tablet Take 5 mg by mouth daily. Frequency:   Dosage:0   MG  Instructions:  Note:1 tablet po daily.  Marland Kitchen aspirin EC 81 MG tablet Take 81 mg by mouth daily. Frequency:   Dosage:0   MG  Instructions:  Note:1 tablet po daily.  Marland Kitchen atorvastatin (LIPITOR) 80 MG tablet Take 1 tablet (80 mg total) by mouth daily.  . Calcium Carb-Cholecalciferol (CALCIUM 1000 + D) 1000-800 MG-UNIT TABS Take 2 tablets by mouth daily.  Marland Kitchen HYDROcodone-acetaminophen (NORCO/VICODIN) 5-325 MG tablet Take 1 tablet by mouth as needed.  . metoprolol succinate (TOPROL-XL) 50 MG 24 hr tablet Take 1 tablet by mouth daily.  . Multiple Vitamins-Minerals (MULTIVITAMIN ADULT PO) Take 1 tablet by mouth daily.  . nitroGLYCERIN (NITROSTAT) 0.4 MG SL tablet Place 1 tablet (0.4 mg total) under the tongue as needed for chest  pain.  . Omeprazole Magnesium 20.6 (20 Base) MG CPDR Take 1 capsule by mouth 2 (two) times daily.  . pregabalin (LYRICA) 75 MG capsule Take 75 mg by mouth daily.   . ranitidine (ZANTAC) 300 MG capsule Take 300 mg by mouth every evening. Frequency:   Dosage:0.0     Instructions:  Note:     Allergies:   Amoxicillin and Sulfa antibiotics   Social History   Socioeconomic History  . Marital status: Married    Spouse name: Not on file  . Number of children: Not on file  . Years of education: Not on file  . Highest education level: Not on file  Occupational History  . Not on file  Social Needs  . Financial resource strain: Not on file  . Food insecurity:     Worry: Not on file    Inability: Not on file  . Transportation needs:    Medical: Not on file    Non-medical: Not on file  Tobacco Use  . Smoking status: Former Smoker    Packs/day: 1.00    Years: 40.00    Pack years: 40.00    Types: Cigarettes    Last attempt to quit: 12/13/2005    Years since quitting: 12.3  . Smokeless tobacco: Never Used  Substance and Sexual Activity  . Alcohol use: No    Alcohol/week: 0.0 standard drinks  . Drug use: No  . Sexual activity: Not on file  Lifestyle  . Physical activity:    Days per week: Not on file    Minutes per session: Not on file  . Stress: Not on file  Relationships  . Social connections:    Talks on phone: Not on file    Gets together: Not on file    Attends religious service: Not on file    Active member of club or organization: Not on file    Attends meetings of clubs or organizations: Not on file    Relationship status: Not on file  Other Topics Concern  . Not on file  Social History Narrative  . Not on file     Family History: The patient's family history includes Breast cancer in her sister.  ROS:   Please see the history of present illness.    All other systems reviewed and are negative.  EKGs/Labs/Other Studies Reviewed:    The following studies were reviewed today: She will have blood work today as she has not had that for quite some time.   Recent Labs: No results found for requested labs within last 8760 hours.  Recent Lipid Panel No results found for: CHOL, TRIG, HDL, CHOLHDL, VLDL, LDLCALC, LDLDIRECT  Physical Exam:    VS:  BP 140/62 (BP Location: Right Arm, Patient Position: Sitting, Cuff Size: Normal)   Pulse 67   Ht 5\' 2"  (1.575 m)   Wt 145 lb 6.4 oz (66 kg)   SpO2 98%   BMI 26.59 kg/m     Wt Readings from Last 3 Encounters:  04/24/18 145 lb 6.4 oz (66 kg)  08/26/17 145 lb (65.8 kg)  11/03/15 128 lb 4 oz (58.2 kg)     GEN: Patient is in no acute distress HEENT: Normal NECK: No JVD; No  carotid bruits LYMPHATICS: No lymphadenopathy CARDIAC: Hear sounds regular, 2/6 systolic murmur at the apex. RESPIRATORY:  Clear to auscultation without rales, wheezing or rhonchi  ABDOMEN: Soft, non-tender, non-distended MUSCULOSKELETAL:  No edema; No deformity  SKIN: Warm and dry NEUROLOGIC:  Alert  and oriented x 3 PSYCHIATRIC:  Normal affect   Signed, Garwin Brothersajan R Chancellor Vanderloop, MD  04/24/2018 8:56 AM    [facility]

## 2018-04-24 NOTE — Patient Instructions (Signed)
Medication Instructions:  Your physician recommends that you continue on your current medications as directed. Please refer to the Current Medication list given to you today.  Labwork: Your physician recommends that you have the following labs drawn: BMP, CBC, TSH, liver and lipid panel.  Testing/Procedures: None  Follow-Up: Your physician recommends that you schedule a follow-up appointment in: 6 months  Any Other Special Instructions Will Be Listed Below (If Applicable).  Your medications will be refilled once labs have resulted.   If you need a refill on your cardiac medications before your next appointment, please call your pharmacy.   [facility]  Garey HamAshley A, RN, BSN

## 2018-04-25 ENCOUNTER — Other Ambulatory Visit: Payer: Self-pay | Admitting: Emergency Medicine

## 2018-04-25 DIAGNOSIS — E785 Hyperlipidemia, unspecified: Secondary | ICD-10-CM

## 2018-04-25 MED ORDER — NITROGLYCERIN 0.4 MG SL SUBL: 0.4000 mg | SUBLINGUAL_TABLET | SUBLINGUAL | 11 refills | Status: DC | PRN

## 2018-04-25 MED ORDER — ATORVASTATIN CALCIUM 80 MG PO TABS: 80.0000 mg | ORAL_TABLET | Freq: Every day | ORAL | 3 refills | Status: DC

## 2018-04-25 NOTE — Telephone Encounter (Signed)
Patient informed of results. Medications atorvastatin and nitroglycerin refilled.

## 2018-04-29 ENCOUNTER — Other Ambulatory Visit: Payer: Self-pay

## 2018-04-29 DIAGNOSIS — E785 Hyperlipidemia, unspecified: Secondary | ICD-10-CM

## 2018-04-29 MED ORDER — NITROGLYCERIN 0.4 MG SL SUBL: 0.4000 mg | SUBLINGUAL_TABLET | SUBLINGUAL | 11 refills | Status: DC | PRN

## 2018-04-29 MED ORDER — METOPROLOL SUCCINATE ER 50 MG PO TB24: 50.0000 mg | ORAL_TABLET | Freq: Every day | ORAL | 2 refills | Status: DC

## 2018-04-29 MED ORDER — ATORVASTATIN CALCIUM 80 MG PO TABS: 80.0000 mg | ORAL_TABLET | Freq: Every day | ORAL | 2 refills | Status: DC

## 2018-09-01 DIAGNOSIS — L309 Dermatitis, unspecified: Secondary | ICD-10-CM | POA: Insufficient documentation

## 2018-09-03 DIAGNOSIS — N183 Chronic kidney disease, stage 3 unspecified: Secondary | ICD-10-CM | POA: Insufficient documentation

## 2018-09-03 DIAGNOSIS — R7301 Impaired fasting glucose: Secondary | ICD-10-CM | POA: Insufficient documentation

## 2018-10-21 ENCOUNTER — Ambulatory Visit (INDEPENDENT_AMBULATORY_CARE_PROVIDER_SITE_OTHER): Payer: Medicare HMO | Admitting: Cardiology

## 2018-10-21 ENCOUNTER — Encounter: Payer: Self-pay | Admitting: Cardiology

## 2018-10-21 VITALS — BP 140/62 | HR 69 | Ht 62.0 in | Wt 145.0 lb

## 2018-10-21 DIAGNOSIS — I2510 Atherosclerotic heart disease of native coronary artery without angina pectoris: Secondary | ICD-10-CM

## 2018-10-21 DIAGNOSIS — E785 Hyperlipidemia, unspecified: Secondary | ICD-10-CM | POA: Diagnosis not present

## 2018-10-21 DIAGNOSIS — Z95828 Presence of other vascular implants and grafts: Secondary | ICD-10-CM

## 2018-10-21 DIAGNOSIS — I10 Essential (primary) hypertension: Secondary | ICD-10-CM

## 2018-10-21 NOTE — Addendum Note (Signed)
Addended by: Carren Rang on: 10/21/2018 03:53 PM   Modules accepted: Orders

## 2018-10-21 NOTE — Patient Instructions (Signed)
Medication Instructions:   Your physician recommends that you continue on your current medications as directed. Please refer to the Current Medication list given to you today.   If you need a refill on your cardiac medications before your next appointment, please call your pharmacy.   Lab work:  NONE   Testing/Procedures:  Your physician has requested that you have an abdominal aorta duplex. During this test, an ultrasound is used to evaluate the aorta. Allow 30 minutes for this exam. Do not eat after midnight the day before and avoid carbonated beverages  Follow-Up: At [facility], you and your health needs are our priority.  As part of our continuing mission to provide you with exceptional heart care, we have created designated Provider Care Teams.  These Care Teams include your primary Cardiologist (physician) and Advanced Practice Providers (APPs -  Physician Assistants and Nurse Practitioners) who all work together to provide you with the care you need, when you need it. . You will need a follow up appointment in 6 months.  Please call our office 2 months in advance to schedule this appointment.

## 2018-10-21 NOTE — Progress Notes (Signed)
Cardiology Office Note:    Date:  10/21/2018   ID:  Kristi Olson, DOB 1935-12-25, MRN [id number]  PCP:  Raynelle Jan., MD  Cardiologist:  Garwin Brothers, MD   Referring MD: Raynelle Jan., MD    ASSESSMENT:    1. Coronary artery disease involving native coronary artery of native heart without angina pectoris   2. Benign essential HTN   3. H/O endovascular stent graft for abdominal aortic aneurysm   4. Dyslipidemia    PLAN:    In order of problems listed above:  1. Secondary prevention stressed with the patient.  Importance of compliance with diet and medication stressed and she verbalized understanding.  Her blood pressure is stable.  Diet was discussed for dyslipidemia.  Recent lipids by primary care physician were checked and she was told that they were fine. 2. We will schedule her for abdominal ultrasound to evaluate the status of the repaired aneurysm 3. Patient will be seen in follow-up appointment in 6 months or earlier if the patient has any concerns    Medication Adjustments/Labs and Tests Ordered: Current medicines are reviewed at length with the patient today.  Concerns regarding medicines are outlined above.  No orders of the defined types were placed in this encounter.  No orders of the defined types were placed in this encounter.    No chief complaint on file.    History of Present Illness:    Kristi Olson is a 83 y.o. female.  Patient has known coronary artery disease.  She leads a sedentary lifestyle because of orthopedic issues.  No chest pain orthopnea or PND.  At the time of my evaluation, the patient is alert awake oriented and in no distress.  She also requests that it is time for her abdominal aortic aneurysm repair to be evaluated.  Her vascular surgeon has moved to another place so she wants [place] to take over the annual evaluation.  Past Medical History:  Diagnosis Date  . Arthritis   . COPD (chronic obstructive pulmonary disease)  (HCC)   . Depression   . Hx of heart artery stent   . Hypercholesteremia   . Hypertension   . Overactive bladder     Past Surgical History:  Procedure Laterality Date  . ABDOMINAL AORTIC ANEURYSM REPAIR    . ABDOMINAL HYSTERECTOMY    . APPENDECTOMY    . BREAST EXCISIONAL BIOPSY    . BREAST SURGERY    . CHOLECYSTECTOMY    . FOOT SURGERY Left   . NECK SURGERY    . TONSILLECTOMY      Current Medications: Current Meds  Medication Sig  . amLODipine (NORVASC) 5 MG tablet Take 5 mg by mouth daily. Frequency:   Dosage:0   MG  Instructions:  Note:1 tablet po daily.  Marland Kitchen aspirin EC 81 MG tablet Take 81 mg by mouth daily. Frequency:   Dosage:0   MG  Instructions:  Note:1 tablet po daily.  Marland Kitchen atorvastatin (LIPITOR) 80 MG tablet Take 1 tablet (80 mg total) by mouth daily.  . Calcium Carb-Cholecalciferol (CALCIUM 1000 + D) 1000-800 MG-UNIT TABS Take 2 tablets by mouth daily.  Marland Kitchen HYDROcodone-acetaminophen (NORCO/VICODIN) 5-325 MG tablet Take 1 tablet by mouth as needed.  . metoprolol succinate (TOPROL-XL) 50 MG 24 hr tablet Take 1 tablet (50 mg total) by mouth daily.  . Multiple Vitamins-Minerals (MULTIVITAMIN ADULT PO) Take 1 tablet by mouth daily.  . nitroGLYCERIN (NITROSTAT) 0.4 MG SL tablet Place 1 tablet (0.4 mg  total) under the tongue as needed for chest pain.  Marland Kitchen Omeprazole Magnesium 20.6 (20 Base) MG CPDR Take 1 capsule by mouth 2 (two) times daily.  . pregabalin (LYRICA) 75 MG capsule Take 75 mg by mouth daily.      Allergies:   Amoxicillin and Sulfa antibiotics   Social History   Socioeconomic History  . Marital status: Married    Spouse name: Not on file  . Number of children: Not on file  . Years of education: Not on file  . Highest education level: Not on file  Occupational History  . Not on file  Social Needs  . Financial resource strain: Not on file  . Food insecurity:    Worry: Not on file    Inability: Not on file  . Transportation needs:    Medical: Not on file     Non-medical: Not on file  Tobacco Use  . Smoking status: Former Smoker    Packs/day: 1.00    Years: 40.00    Pack years: 40.00    Types: Cigarettes    Last attempt to quit: 12/13/2005    Years since quitting: 12.8  . Smokeless tobacco: Never Used  Substance and Sexual Activity  . Alcohol use: No    Alcohol/week: 0.0 standard drinks  . Drug use: No  . Sexual activity: Not on file  Lifestyle  . Physical activity:    Days per week: Not on file    Minutes per session: Not on file  . Stress: Not on file  Relationships  . Social connections:    Talks on phone: Not on file    Gets together: Not on file    Attends religious service: Not on file    Active member of club or organization: Not on file    Attends meetings of clubs or organizations: Not on file    Relationship status: Not on file  Other Topics Concern  . Not on file  Social History Narrative  . Not on file     Family History: The patient's family history includes Breast cancer in her sister.  ROS:   Please see the history of present illness.    All other systems reviewed and are negative.  EKGs/Labs/Other Studies Reviewed:    The following studies were reviewed today: I discussed my findings with the patient at extensive length.   Recent Labs: 04/24/2018: ALT 29; BUN 18; Creatinine, Ser 1.19; Hemoglobin 12.3; Platelets 194; Potassium 4.6; Sodium 143; TSH 0.998  Recent Lipid Panel    Component Value Date/Time   CHOL 172 04/24/2018 0904   TRIG 251 (H) 04/24/2018 0904   HDL 49 04/24/2018 0904   CHOLHDL 3.5 04/24/2018 0904   LDLCALC 73 04/24/2018 0904    Physical Exam:    VS:  BP 140/62 (BP Location: Right Arm, Patient Position: Sitting, Cuff Size: Normal)   Pulse 69   Ht 5\' 2"  (1.575 m)   Wt 145 lb (65.8 kg)   SpO2 95%   BMI 26.52 kg/m     Wt Readings from Last 3 Encounters:  10/21/18 145 lb (65.8 kg)  04/24/18 145 lb 6.4 oz (66 kg)  08/26/17 145 lb (65.8 kg)     GEN: Patient is in no acute  distress HEENT: Normal NECK: No JVD; No carotid bruits LYMPHATICS: No lymphadenopathy CARDIAC: Hear sounds regular, 2/6 systolic murmur at the apex. RESPIRATORY:  Clear to auscultation without rales, wheezing or rhonchi  ABDOMEN: Soft, non-tender, non-distended MUSCULOSKELETAL:  No edema; No  deformity  SKIN: Warm and dry NEUROLOGIC:  Alert and oriented x 3 PSYCHIATRIC:  Normal affect   Signed, Garwin Brothersajan R Thurma Priego, MD  10/21/2018 10:14 AM    [facility]

## 2018-11-24 ENCOUNTER — Telehealth: Payer: Self-pay | Admitting: Cardiology

## 2018-11-24 NOTE — Telephone Encounter (Signed)
Vascular 04/02

## 2018-11-27 ENCOUNTER — Other Ambulatory Visit: Payer: Medicare HMO

## 2018-12-03 NOTE — Telephone Encounter (Signed)
Tried to reach pt to reschedule testing to be done in 03/2019 though vm not set and no answer.

## 2019-01-30 ENCOUNTER — Other Ambulatory Visit: Payer: Self-pay | Admitting: Cardiology

## 2019-03-02 DIAGNOSIS — M2550 Pain in unspecified joint: Secondary | ICD-10-CM | POA: Insufficient documentation

## 2019-04-01 ENCOUNTER — Telehealth: Payer: Self-pay | Admitting: Cardiology

## 2019-04-01 ENCOUNTER — Other Ambulatory Visit: Payer: Self-pay

## 2019-04-01 ENCOUNTER — Ambulatory Visit (INDEPENDENT_AMBULATORY_CARE_PROVIDER_SITE_OTHER): Payer: Medicare HMO

## 2019-04-01 ENCOUNTER — Telehealth: Payer: Self-pay | Admitting: Emergency Medicine

## 2019-04-01 DIAGNOSIS — Z95828 Presence of other vascular implants and grafts: Secondary | ICD-10-CM | POA: Diagnosis not present

## 2019-04-01 DIAGNOSIS — I2510 Atherosclerotic heart disease of native coronary artery without angina pectoris: Secondary | ICD-10-CM | POA: Diagnosis not present

## 2019-04-01 DIAGNOSIS — E785 Hyperlipidemia, unspecified: Secondary | ICD-10-CM | POA: Diagnosis not present

## 2019-04-01 DIAGNOSIS — I10 Essential (primary) hypertension: Secondary | ICD-10-CM

## 2019-04-01 NOTE — Telephone Encounter (Signed)
Left message for patient to return call for results.

## 2019-04-01 NOTE — Telephone Encounter (Signed)
Called patient back - informed of results

## 2019-04-01 NOTE — Progress Notes (Signed)
Abdominal Aortic duplex exam has been performed. S/P EVAR, no new aneurysms were seen, Prosthesis appears to be intact no leaks were seen.   Jimmy Lyndee Herbst RDCS, RVT

## 2019-04-21 ENCOUNTER — Ambulatory Visit: Payer: Medicare HMO | Admitting: Cardiology

## 2019-05-01 ENCOUNTER — Other Ambulatory Visit: Payer: Self-pay

## 2019-05-01 ENCOUNTER — Encounter: Payer: Self-pay | Admitting: Cardiology

## 2019-05-01 ENCOUNTER — Ambulatory Visit (INDEPENDENT_AMBULATORY_CARE_PROVIDER_SITE_OTHER): Payer: Medicare HMO | Admitting: Cardiology

## 2019-05-01 VITALS — BP 132/66 | HR 68 | Wt 138.2 lb

## 2019-05-01 DIAGNOSIS — E785 Hyperlipidemia, unspecified: Secondary | ICD-10-CM

## 2019-05-01 DIAGNOSIS — I714 Abdominal aortic aneurysm, without rupture, unspecified: Secondary | ICD-10-CM

## 2019-05-01 DIAGNOSIS — I2510 Atherosclerotic heart disease of native coronary artery without angina pectoris: Secondary | ICD-10-CM | POA: Diagnosis not present

## 2019-05-01 DIAGNOSIS — I10 Essential (primary) hypertension: Secondary | ICD-10-CM | POA: Diagnosis not present

## 2019-05-01 DIAGNOSIS — Z95828 Presence of other vascular implants and grafts: Secondary | ICD-10-CM

## 2019-05-01 NOTE — Patient Instructions (Signed)

## 2019-05-01 NOTE — Progress Notes (Signed)
Cardiology Office Note:    Date:  05/01/2019   ID:  Kristi Olson, DOB [birth year], MRN [id number]  PCP:  Raynelle Jan., MD  Cardiologist:  Garwin Brothers, MD   Referring MD: Raynelle Jan., MD    ASSESSMENT:    1. Coronary artery disease involving native coronary artery of native heart without angina pectoris   2. Benign essential HTN   3. Abdominal aortic aneurysm (AAA) without rupture (HCC)   4. Dyslipidemia   5. H/O endovascular stent graft for abdominal aortic aneurysm    PLAN:    In order of problems listed above:  1. Coronary artery disease: Secondary prevention stressed with the patient.  Importance of compliance with diet and medication stressed and she vocalized understanding.  Her blood pressure is stable. Essential hypertension: Blood pressure stable diet was discussed Mixed dyslipidemia: I reviewed lab work and recommended diet lower in carbohydrates in view of elevated triglycerides and she promises to comply Patient will be seen in follow-up appointment in 6 months or earlier if the patient has any concerns    Medication Adjustments/Labs and Tests Ordered: Current medicines are reviewed at length with the patient today.  Concerns regarding medicines are outlined above.  No orders of the defined types were placed in this encounter.  No orders of the defined types were placed in this encounter.    No chief complaint on file.    History of Present Illness:    Kristi Olson is a 83 y.o. female.  Patient denies any problems at this time.  She is here for follow-up for coronary artery disease, essential hypertension and dyslipidemia.  She has abdominal aortic aneurysm repaired in the last ultrasound was fine.  At the time of my evaluation, the patient is alert awake oriented and in no distress.  She has orthopedic issues and therefore she does not exercise much but is an active lady.  Past Medical History:  Diagnosis Date  . Arthritis   . COPD  (chronic obstructive pulmonary disease) (HCC)   . Depression   . Hx of heart artery stent   . Hypercholesteremia   . Hypertension   . Overactive bladder     Past Surgical History:  Procedure Laterality Date  . ABDOMINAL AORTIC ANEURYSM REPAIR    . ABDOMINAL HYSTERECTOMY    . APPENDECTOMY    . BREAST EXCISIONAL BIOPSY    . BREAST SURGERY    . CHOLECYSTECTOMY    . FOOT SURGERY Left   . NECK SURGERY    . TONSILLECTOMY      Current Medications: Current Meds  Medication Sig  . amLODipine (NORVASC) 5 MG tablet Take 5 mg by mouth daily. Frequency:   Dosage:0   MG  Instructions:  Note:1 tablet po daily.  Marland Kitchen aspirin EC 81 MG tablet Take 162 mg by mouth daily. Frequency:   Dosage:0   MG  Instructions:  Note:1 tablet po daily.  Marland Kitchen atorvastatin (LIPITOR) 80 MG tablet Take 1 tablet (80 mg total) by mouth daily.  . Calcium Carb-Cholecalciferol (CALCIUM 1000 + D) 1000-800 MG-UNIT TABS Take 2 tablets by mouth daily.  Marland Kitchen HYDROcodone-acetaminophen (NORCO/VICODIN) 5-325 MG tablet Take 1 tablet by mouth as needed.  . metoprolol succinate (TOPROL-XL) 50 MG 24 hr tablet Take 1 tablet by mouth once daily  . Multiple Vitamins-Minerals (MULTIVITAMIN ADULT PO) Take 1 tablet by mouth daily.  . nitroGLYCERIN (NITROSTAT) 0.4 MG SL tablet Place 1 tablet (0.4 mg total) under the tongue as needed for  chest pain.  Marland Kitchen Omeprazole Magnesium 20.6 (20 Base) MG CPDR Take 1 capsule by mouth 2 (two) times daily.  . predniSONE (DELTASONE) 5 MG tablet Take 5 mg by mouth daily with breakfast.  . pregabalin (LYRICA) 75 MG capsule Take 75 mg by mouth daily.      Allergies:   Amoxicillin and Sulfa antibiotics   Social History   Socioeconomic History  . Marital status: Married    Spouse name: Not on file  . Number of children: Not on file  . Years of education: Not on file  . Highest education level: Not on file  Occupational History  . Not on file  Social Needs  . Financial resource strain: Not on file  . Food  insecurity    Worry: Not on file    Inability: Not on file  . Transportation needs    Medical: Not on file    Non-medical: Not on file  Tobacco Use  . Smoking status: Former Smoker    Packs/day: 1.00    Years: 40.00    Pack years: 40.00    Types: Cigarettes    Quit date: 12/13/2005    Years since quitting: 13.3  . Smokeless tobacco: Never Used  Substance and Sexual Activity  . Alcohol use: No    Alcohol/week: 0.0 standard drinks  . Drug use: No  . Sexual activity: Not on file  Lifestyle  . Physical activity    Days per week: Not on file    Minutes per session: Not on file  . Stress: Not on file  Relationships  . Social Herbalist on phone: Not on file    Gets together: Not on file    Attends religious service: Not on file    Active member of club or organization: Not on file    Attends meetings of clubs or organizations: Not on file    Relationship status: Not on file  Other Topics Concern  . Not on file  Social History Narrative  . Not on file     Family History: The patient's family history includes Breast cancer in her sister.  ROS:   Please see the history of present illness.    All other systems reviewed and are negative.  EKGs/Labs/Other Studies Reviewed:    The following studies were reviewed today: Summary: Abdominal Aorta: No evidence of an abdominal aortic aneurysm was visualized. The largest aortic measurement is 2.3 cm. Status post distal abdominal aortic aneurysm repair. Prostheses appears to be intact. No endoleak seen.      Recent Labs: No results found for requested labs within last 8760 hours.  Recent Lipid Panel    Component Value Date/Time   CHOL 172 04/24/2018 0904   TRIG 251 (H) 04/24/2018 0904   HDL 49 04/24/2018 0904   CHOLHDL 3.5 04/24/2018 0904   LDLCALC 73 04/24/2018 0904    Physical Exam:    VS:  BP 132/66 (BP Location: Left Arm, Patient Position: Sitting, Cuff Size: Normal)   Pulse 68   Wt 138 lb 3.2 oz (62.7  kg)   SpO2 96%   BMI 25.28 kg/m     Wt Readings from Last 3 Encounters:  05/01/19 138 lb 3.2 oz (62.7 kg)  10/21/18 145 lb (65.8 kg)  04/24/18 145 lb 6.4 oz (66 kg)     GEN: Patient is in no acute distress HEENT: Normal NECK: No JVD; No carotid bruits LYMPHATICS: No lymphadenopathy CARDIAC: Hear sounds regular, 2/6 systolic murmur at  the apex. RESPIRATORY:  Clear to auscultation without rales, wheezing or rhonchi  ABDOMEN: Soft, non-tender, non-distended MUSCULOSKELETAL:  No edema; No deformity  SKIN: Warm and dry NEUROLOGIC:  Alert and oriented x 3 PSYCHIATRIC:  Normal affect   Signed, Garwin Brothersajan R Afrika Brick, MD  05/01/2019 11:00 AM    [facility]

## 2019-05-11 ENCOUNTER — Other Ambulatory Visit: Payer: Self-pay | Admitting: Cardiology

## 2019-05-18 ENCOUNTER — Other Ambulatory Visit: Payer: Self-pay | Admitting: Cardiology

## 2019-05-18 DIAGNOSIS — E785 Hyperlipidemia, unspecified: Secondary | ICD-10-CM

## 2019-09-06 ENCOUNTER — Other Ambulatory Visit: Payer: Self-pay | Admitting: Cardiology

## 2019-09-06 DIAGNOSIS — E785 Hyperlipidemia, unspecified: Secondary | ICD-10-CM

## 2019-10-30 ENCOUNTER — Ambulatory Visit (INDEPENDENT_AMBULATORY_CARE_PROVIDER_SITE_OTHER): Payer: Medicare HMO | Admitting: Cardiology

## 2019-10-30 ENCOUNTER — Encounter: Payer: Self-pay | Admitting: Cardiology

## 2019-10-30 ENCOUNTER — Other Ambulatory Visit: Payer: Self-pay

## 2019-10-30 VITALS — BP 146/70 | HR 66 | Ht 62.0 in | Wt 144.0 lb

## 2019-10-30 DIAGNOSIS — E785 Hyperlipidemia, unspecified: Secondary | ICD-10-CM | POA: Diagnosis not present

## 2019-10-30 DIAGNOSIS — I2510 Atherosclerotic heart disease of native coronary artery without angina pectoris: Secondary | ICD-10-CM

## 2019-10-30 DIAGNOSIS — Z95828 Presence of other vascular implants and grafts: Secondary | ICD-10-CM

## 2019-10-30 DIAGNOSIS — I10 Essential (primary) hypertension: Secondary | ICD-10-CM | POA: Diagnosis not present

## 2019-10-30 DIAGNOSIS — Z1329 Encounter for screening for other suspected endocrine disorder: Secondary | ICD-10-CM

## 2019-10-30 MED ORDER — NITROGLYCERIN 0.4 MG SL SUBL: 0.4000 mg | SUBLINGUAL_TABLET | SUBLINGUAL | 11 refills | Status: DC | PRN

## 2019-10-30 NOTE — Progress Notes (Signed)
Cardiology Office Note:    Date:  10/30/2019   ID:  Kristi Olson, DOB April 24, 1936, MRN [id number]  PCP:  Verdell Carmine., MD  Cardiologist:  Jenean Lindau, MD   Referring MD: Verdell Carmine., MD    ASSESSMENT:    1. Benign essential HTN   2. Coronary artery disease involving native coronary artery of native heart without angina pectoris   3. H/O endovascular stent graft for abdominal aortic aneurysm   4. Dyslipidemia    PLAN:    In order of problems listed above:  1. Coronary artery disease: Secondary prevention stressed with the patient.  Importance of compliance with diet medication stressed and she vocalized understanding. 2. Essential hypertension: Blood pressure is stable she has an element of whitecoat hypertension.  Her blood pressures run fine at home. 3. Mixed dyslipidemia: Diet was discussed she will have blood work today including fasting lipids 4. Sublingual nitroglycerin prescription was sent, its protocol and 911 protocol explained and the patient vocalized understanding questions were answered to the patient's satisfaction.  5. Abdominal aortic aneurysm post repair: Sonogram report was discussed with her at length. 6. Patient will be seen in follow-up appointment in 6 months or earlier if the patient has any concerns    Medication Adjustments/Labs and Tests Ordered: Current medicines are reviewed at length with the patient today.  Concerns regarding medicines are outlined above.  No orders of the defined types were placed in this encounter.  No orders of the defined types were placed in this encounter.    Chief Complaint  Patient presents with  . Follow-up    6 Months     History of Present Illness:    Kristi Olson is a 84 y.o. female.  Patient has past medical history of coronary artery disease, AAA abdominal aneurysm repair, essential hypertension and dyslipidemia.  She denies any problems at this time and takes care of activities of daily  living.  No chest pain orthopnea or PND.  She is living a sedentary lifestyle because of orthopedic issues involving her back and knee.  She is planning to get injections from this from her orthopedic doctor.  Past Medical History:  Diagnosis Date  . Arthritis   . COPD (chronic obstructive pulmonary disease) ([place])   . Depression   . Hx of heart artery stent   . Hypercholesteremia   . Hypertension   . Overactive bladder     Past Surgical History:  Procedure Laterality Date  . ABDOMINAL AORTIC ANEURYSM REPAIR    . ABDOMINAL HYSTERECTOMY    . APPENDECTOMY    . BREAST EXCISIONAL BIOPSY    . BREAST SURGERY    . CHOLECYSTECTOMY    . FOOT SURGERY Left   . NECK SURGERY    . TONSILLECTOMY      Current Medications: Current Meds  Medication Sig  . aspirin EC 81 MG tablet Take 162 mg by mouth daily. Frequency:   Dosage:0   MG  Instructions:  Note:1 tablet po daily.  Marland Kitchen atorvastatin (LIPITOR) 80 MG tablet Take 1 tablet by mouth once daily  . Calcium Carb-Cholecalciferol (CALCIUM 1000 + D) 1000-800 MG-UNIT TABS Take 2 tablets by mouth daily.  . diphenhydrAMINE (BENADRYL) 25 MG tablet Take 25 mg by mouth every 8 (eight) hours as needed.  . hydrochlorothiazide (HYDRODIURIL) 25 MG tablet Take 25 mg by mouth daily.  . metoprolol succinate (TOPROL-XL) 50 MG 24 hr tablet Take 1 tablet by mouth once daily  . Multiple Vitamins-Minerals (  MULTIVITAMIN ADULT PO) Take 1 tablet by mouth daily.  . nitroGLYCERIN (NITROSTAT) 0.4 MG SL tablet Place 1 tablet (0.4 mg total) under the tongue as needed for chest pain.  Marland Kitchen Omeprazole Magnesium 20.6 (20 Base) MG CPDR Take 1 capsule by mouth 2 (two) times daily.  . potassium chloride SA (KLOR-CON) 20 MEQ tablet Take 20 mEq by mouth daily.     Allergies:   Amoxicillin and Sulfa antibiotics   Social History   Socioeconomic History  . Marital status: Married    Spouse name: Not on file  . Number of children: Not on file  . Years of education: Not on file  .  Highest education level: Not on file  Occupational History  . Not on file  Tobacco Use  . Smoking status: Former Smoker    Packs/day: 1.00    Years: 40.00    Pack years: 40.00    Types: Cigarettes    Quit date: 12/13/2005    Years since quitting: 13.8  . Smokeless tobacco: Never Used  Substance and Sexual Activity  . Alcohol use: No    Alcohol/week: 0.0 standard drinks  . Drug use: No  . Sexual activity: Not on file  Other Topics Concern  . Not on file  Social History Narrative  . Not on file   Social Determinants of Health   Financial Resource Strain:   . Difficulty of Paying Living Expenses: Not on file  Food Insecurity:   . Worried About Programme researcher, broadcasting/film/video in the Last Year: Not on file  . Ran Out of Food in the Last Year: Not on file  Transportation Needs:   . Lack of Transportation (Medical): Not on file  . Lack of Transportation (Non-Medical): Not on file  Physical Activity:   . Days of Exercise per Week: Not on file  . Minutes of Exercise per Session: Not on file  Stress:   . Feeling of Stress : Not on file  Social Connections:   . Frequency of Communication with Friends and Family: Not on file  . Frequency of Social Gatherings with Friends and Family: Not on file  . Attends Religious Services: Not on file  . Active Member of Clubs or Organizations: Not on file  . Attends Banker Meetings: Not on file  . Marital Status: Not on file     Family History: The patient's family history includes Breast cancer in her sister.  ROS:   Please see the history of present illness.    All other systems reviewed and are negative.  EKGs/Labs/Other Studies Reviewed:    The following studies were reviewed today: I discussed findings and lab work with the patient at length   Recent Labs: No results found for requested labs within last 8760 hours.  Recent Lipid Panel    Component Value Date/Time   CHOL 172 04/24/2018 0904   TRIG 251 (H) 04/24/2018 0904    HDL 49 04/24/2018 0904   CHOLHDL 3.5 04/24/2018 0904   LDLCALC 73 04/24/2018 0904    Physical Exam:    VS:  BP (!) 146/70   Pulse 66   Ht 5\' 2"  (1.575 m)   Wt 144 lb (65.3 kg)   SpO2 95%   BMI 26.34 kg/m     Wt Readings from Last 3 Encounters:  10/30/19 144 lb (65.3 kg)  05/01/19 138 lb 3.2 oz (62.7 kg)  10/21/18 145 lb (65.8 kg)     GEN: Patient is in no acute distress  HEENT: Normal NECK: No JVD; No carotid bruits LYMPHATICS: No lymphadenopathy CARDIAC: Hear sounds regular, 2/6 systolic murmur at the apex. RESPIRATORY:  Clear to auscultation without rales, wheezing or rhonchi  ABDOMEN: Soft, non-tender, non-distended MUSCULOSKELETAL:  No edema; No deformity  SKIN: Warm and dry NEUROLOGIC:  Alert and oriented x 3 PSYCHIATRIC:  Normal affect   Signed, Garwin Brothers, MD  10/30/2019 10:48 AM    [facility]

## 2019-10-30 NOTE — Patient Instructions (Signed)
Medication Instructions:  Your physician has recommended you make the following change in your medication:   Take nitroglycerin as needed for chest pain  *If you need a refill on your cardiac medications before your next appointment, please call your pharmacy*   Lab Work: You had a BMET,CBC,LFT,TSH,and Lipids. If you have labs (blood work) drawn today and your tests are completely normal, you will receive your results only by: Marland Kitchen MyChart Message (if you have MyChart) OR . A paper copy in the mail If you have any lab test that is abnormal or we need to change your treatment, we will call you to review the results.   Testing/Procedures: None ordered   Follow-Up: At [facility], you and your health needs are our priority.  As part of our continuing mission to provide you with exceptional heart care, we have created designated Provider Care Teams.  These Care Teams include your primary Cardiologist (physician) and Advanced Practice Providers (APPs -  Physician Assistants and Nurse Practitioners) who all work together to provide you with the care you need, when you need it.  We recommend signing up for the patient portal called "MyChart".  Sign up information is provided on this After Visit Summary.  MyChart is used to connect with patients for Virtual Visits (Telemedicine).  Patients are able to view lab/test results, encounter notes, upcoming appointments, etc.  Non-urgent messages can be sent to your provider as well.   To learn more about what you can do with MyChart, go to [email or web address].    Your next appointment:   6 month(s)  The format for your next appointment:   In Person  Provider:   Belva Crome, MD   Other Instructions NA

## 2019-10-31 LAB — CBC WITH DIFFERENTIAL/PLATELET
Basophils Absolute: 0 10*3/uL (ref 0.0–0.2)
Basos: 0 %
EOS (ABSOLUTE): 0.5 10*3/uL — ABNORMAL HIGH (ref 0.0–0.4)
Eos: 8 %
Hematocrit: 36.1 % (ref 34.0–46.6)
Hemoglobin: 12.1 g/dL (ref 11.1–15.9)
Immature Grans (Abs): 0 10*3/uL (ref 0.0–0.1)
Immature Granulocytes: 0 %
Lymphocytes Absolute: 1.5 10*3/uL (ref 0.7–3.1)
Lymphs: 21 %
MCH: 30.5 pg (ref 26.6–33.0)
MCHC: 33.5 g/dL (ref 31.5–35.7)
MCV: 91 fL (ref 79–97)
Monocytes Absolute: 0.6 10*3/uL (ref 0.1–0.9)
Monocytes: 8 %
Neutrophils Absolute: 4.5 10*3/uL (ref 1.4–7.0)
Neutrophils: 63 %
Platelets: 205 10*3/uL (ref 150–450)
RBC: 3.97 x10E6/uL (ref 3.77–5.28)
RDW: 13.3 % (ref 11.7–15.4)
WBC: 7.2 10*3/uL (ref 3.4–10.8)

## 2019-10-31 LAB — HEPATIC FUNCTION PANEL
ALT: 27 IU/L (ref 0–32)
AST: 21 IU/L (ref 0–40)
Albumin: 4.2 g/dL (ref 3.6–4.6)
Alkaline Phosphatase: 130 IU/L — ABNORMAL HIGH (ref 39–117)
Bilirubin Total: 0.2 mg/dL (ref 0.0–1.2)
Bilirubin, Direct: 0.08 mg/dL (ref 0.00–0.40)
Total Protein: 6.4 g/dL (ref 6.0–8.5)

## 2019-10-31 LAB — BASIC METABOLIC PANEL
BUN/Creatinine Ratio: 18 (ref 12–28)
BUN: 24 mg/dL (ref 8–27)
CO2: 22 mmol/L (ref 20–29)
Calcium: 9.1 mg/dL (ref 8.7–10.3)
Chloride: 106 mmol/L (ref 96–106)
Creatinine, Ser: 1.3 mg/dL — ABNORMAL HIGH (ref 0.57–1.00)
GFR calc Af Amer: 44 mL/min/{1.73_m2} — ABNORMAL LOW (ref >59)
GFR calc non Af Amer: 39 mL/min/{1.73_m2} — ABNORMAL LOW (ref >59)
Glucose: 135 mg/dL — ABNORMAL HIGH (ref 65–99)
Potassium: 4.7 mmol/L (ref 3.5–5.2)
Sodium: 142 mmol/L (ref 134–144)

## 2019-10-31 LAB — LIPID PANEL
Chol/HDL Ratio: 3.5 ratio (ref 0.0–4.4)
Cholesterol, Total: 167 mg/dL (ref 100–199)
HDL: 48 mg/dL (ref >39)
LDL Chol Calc (NIH): 74 mg/dL (ref 0–99)
Triglycerides: 280 mg/dL — ABNORMAL HIGH (ref 0–149)
VLDL Cholesterol Cal: 45 mg/dL — ABNORMAL HIGH (ref 5–40)

## 2019-10-31 LAB — TSH: TSH: 0.833 u[IU]/mL (ref 0.450–4.500)

## 2019-11-02 ENCOUNTER — Other Ambulatory Visit: Payer: Self-pay

## 2019-11-02 DIAGNOSIS — E785 Hyperlipidemia, unspecified: Secondary | ICD-10-CM

## 2019-11-02 MED ORDER — FENOFIBRATE 145 MG PO TABS: 145.0000 mg | ORAL_TABLET | Freq: Every day | ORAL | 3 refills | Status: DC

## 2019-11-09 ENCOUNTER — Other Ambulatory Visit: Payer: Self-pay | Admitting: Cardiology

## 2019-12-01 ENCOUNTER — Other Ambulatory Visit: Payer: Self-pay | Admitting: Cardiology

## 2019-12-01 DIAGNOSIS — E785 Hyperlipidemia, unspecified: Secondary | ICD-10-CM

## 2020-02-03 ENCOUNTER — Telehealth: Payer: Self-pay

## 2020-02-03 MED ORDER — FENOFIBRATE 145 MG PO TABS: 145.0000 mg | ORAL_TABLET | Freq: Every day | ORAL | 2 refills | Status: AC

## 2020-02-03 NOTE — Telephone Encounter (Signed)
Rx refill sent for Fenofibrate to [facility].

## 2020-05-04 ENCOUNTER — Ambulatory Visit: Payer: Medicare HMO | Admitting: Cardiology

## 2020-05-04 ENCOUNTER — Encounter: Payer: Self-pay | Admitting: Cardiology

## 2020-05-04 ENCOUNTER — Other Ambulatory Visit: Payer: Self-pay

## 2020-05-04 VITALS — BP 138/70 | HR 64 | Ht 62.0 in | Wt 142.6 lb

## 2020-05-04 DIAGNOSIS — E785 Hyperlipidemia, unspecified: Secondary | ICD-10-CM

## 2020-05-04 DIAGNOSIS — Z95828 Presence of other vascular implants and grafts: Secondary | ICD-10-CM | POA: Diagnosis not present

## 2020-05-04 DIAGNOSIS — I714 Abdominal aortic aneurysm, without rupture, unspecified: Secondary | ICD-10-CM

## 2020-05-04 DIAGNOSIS — I2510 Atherosclerotic heart disease of native coronary artery without angina pectoris: Secondary | ICD-10-CM | POA: Diagnosis not present

## 2020-05-04 NOTE — Addendum Note (Signed)
Addended by: Brien Mates R on: 05/04/2020 11:02 AM   Modules accepted: Orders

## 2020-05-04 NOTE — Progress Notes (Signed)
Cardiology Office Note:    Date:  05/04/2020   ID:  Kristi Olson, DOB 07-Apr-1936, MRN [id number]  PCP:  Raynelle Jan., MD  Cardiologist:  Garwin Brothers, MD   Referring MD: Raynelle Jan., MD    ASSESSMENT:    1. Coronary artery disease involving native coronary artery of native heart without angina pectoris   2. Abdominal aortic aneurysm (AAA) without rupture ([facility])   3. H/O endovascular stent graft for abdominal aortic aneurysm   4. Dyslipidemia    PLAN:    In order of problems listed above:  1. Coronary artery disease: Secondary prevention stressed with the patient.  Importance of compliance with diet medication stressed and she vocalized understanding.  She walks on a regular basis and will try to do better. 2. Essential hypertension: Blood pressure is fine and she is taking appropriate measures and lifestyle modification. 3. Mixed dyslipidemia: Lipids were reviewed and she will have blood work today as she is fasting.  We will send a copy of this to her primary care. 4. Post abdominal aortic aneurysm and stent graft: Appears stable at this time.  Report mentioned below. 5. Patient will be seen in follow-up appointment in 6 months or earlier if the patient has any concerns    Medication Adjustments/Labs and Tests Ordered: Current medicines are reviewed at length with the patient today.  Concerns regarding medicines are outlined above.  No orders of the defined types were placed in this encounter.  No orders of the defined types were placed in this encounter.    No chief complaint on file.    History of Present Illness:    Kristi Olson is a 84 y.o. female.  Patient has past medical history of coronary artery disease, abdominal aortic aneurysm post stenting and dyslipidemia.  She denies any problems at this time and takes care of activities of daily living.  No chest pain orthopnea or PND.  At the time of my evaluation, the patient is alert awake oriented  and in no distress.  Past Medical History:  Diagnosis Date  . Arthritis   . COPD (chronic obstructive pulmonary disease) ([facility])   . Depression   . Hx of heart artery stent   . Hypercholesteremia   . Hypertension   . Overactive bladder     Past Surgical History:  Procedure Laterality Date  . ABDOMINAL AORTIC ANEURYSM REPAIR    . ABDOMINAL HYSTERECTOMY    . APPENDECTOMY    . BREAST EXCISIONAL BIOPSY    . BREAST SURGERY    . CHOLECYSTECTOMY    . FOOT SURGERY Left   . NECK SURGERY    . TONSILLECTOMY      Current Medications: Current Meds  Medication Sig  . aspirin EC 81 MG tablet Take 162 mg by mouth daily. Frequency:   Dosage:0   MG  Instructions:  Note:1 tablet po daily.  Marland Kitchen atorvastatin (LIPITOR) 80 MG tablet Take 1 tablet by mouth once daily  . Calcium Carb-Cholecalciferol (CALCIUM 1000 + D) 1000-800 MG-UNIT TABS Take 2 tablets by mouth daily.  . diphenhydrAMINE (BENADRYL) 25 MG tablet Take 25 mg by mouth every 8 (eight) hours as needed.  . fenofibrate (TRICOR) 145 MG tablet Take 1 tablet (145 mg total) by mouth daily.  . hydrochlorothiazide (HYDRODIURIL) 25 MG tablet Take 25 mg by mouth daily.  Marland Kitchen linaclotide (LINZESS) 290 MCG CAPS capsule Take 1 capsule by mouth daily.  . metoprolol succinate (TOPROL-XL) 50 MG 24 hr tablet Take  1 tablet by mouth once daily  . Multiple Vitamins-Minerals (MULTIVITAMIN ADULT PO) Take 1 tablet by mouth daily.  . nitroGLYCERIN (NITROSTAT) 0.4 MG SL tablet Place 1 tablet (0.4 mg total) under the tongue as needed for chest pain.  Marland Kitchen Omeprazole Magnesium 20.6 (20 Base) MG CPDR Take 1 capsule by mouth daily.   . potassium chloride SA (KLOR-CON) 20 MEQ tablet Take 20 mEq by mouth daily.  Marland Kitchen triamcinolone cream (KENALOG) 0.1 % Apply topically daily as needed.     Allergies:   Amoxicillin and Sulfa antibiotics   Social History   Socioeconomic History  . Marital status: Married    Spouse name: Not on file  . Number of children: Not on file  .  Years of education: Not on file  . Highest education level: Not on file  Occupational History  . Not on file  Tobacco Use  . Smoking status: Former Smoker    Packs/day: 1.00    Years: 40.00    Pack years: 40.00    Types: Cigarettes    Quit date: 12/13/2005    Years since quitting: 14.4  . Smokeless tobacco: Never Used  Substance and Sexual Activity  . Alcohol use: No    Alcohol/week: 0.0 standard drinks  . Drug use: No  . Sexual activity: Not on file  Other Topics Concern  . Not on file  Social History Narrative  . Not on file   Social Determinants of Health   Financial Resource Strain:   . Difficulty of Paying Living Expenses: Not on file  Food Insecurity:   . Worried About Programme researcher, broadcasting/film/video in the Last Year: Not on file  . Ran Out of Food in the Last Year: Not on file  Transportation Needs:   . Lack of Transportation (Medical): Not on file  . Lack of Transportation (Non-Medical): Not on file  Physical Activity:   . Days of Exercise per Week: Not on file  . Minutes of Exercise per Session: Not on file  Stress:   . Feeling of Stress : Not on file  Social Connections:   . Frequency of Communication with Friends and Family: Not on file  . Frequency of Social Gatherings with Friends and Family: Not on file  . Attends Religious Services: Not on file  . Active Member of Clubs or Organizations: Not on file  . Attends Banker Meetings: Not on file  . Marital Status: Not on file     Family History: The patient's family history includes Breast cancer in her sister.  ROS:   Please see the history of present illness.    All other systems reviewed and are negative.  EKGs/Labs/Other Studies Reviewed:    The following studies were reviewed today:  EKG reveals sinus rhythm and poor anterior forces.   Summary:  Abdominal Aorta: No evidence of an abdominal aortic aneurysm was  visualized. The largest aortic measurement is 2.3 cm. Status post distal    abdominal aortic aneurysm repair. Prostheses appears to be intact. No  endoleak seen.    *See table(s) above for measurements and observations.    Electronically signed by Norman Herrlich MD on 04/01/2019 at 12:20:24 PM.    Recent Labs: 10/30/2019: ALT 27; BUN 24; Creatinine, Ser 1.30; Hemoglobin 12.1; Platelets 205; Potassium 4.7; Sodium 142; TSH 0.833  Recent Lipid Panel    Component Value Date/Time   CHOL 167 10/30/2019 1106   TRIG 280 (H) 10/30/2019 1106   HDL 48 10/30/2019 1106  CHOLHDL 3.5 10/30/2019 1106   LDLCALC 74 10/30/2019 1106    Physical Exam:    VS:  BP 138/70   Pulse 64   Ht 5\' 2"  (1.575 m)   Wt 142 lb 9.6 oz (64.7 kg)   SpO2 97%   BMI 26.08 kg/m     Wt Readings from Last 3 Encounters:  05/04/20 142 lb 9.6 oz (64.7 kg)  10/30/19 144 lb (65.3 kg)  05/01/19 138 lb 3.2 oz (62.7 kg)     GEN: Patient is in no acute distress HEENT: Normal NECK: No JVD; No carotid bruits LYMPHATICS: No lymphadenopathy CARDIAC: Hear sounds regular, 2/6 systolic murmur at the apex. RESPIRATORY:  Clear to auscultation without rales, wheezing or rhonchi  ABDOMEN: Soft, non-tender, non-distended MUSCULOSKELETAL:  No edema; No deformity  SKIN: Warm and dry NEUROLOGIC:  Alert and oriented x 3 PSYCHIATRIC:  Normal affect   Signed, 07/01/19, MD  05/04/2020 10:48 AM    [facility]

## 2020-05-04 NOTE — Patient Instructions (Signed)
Medication Instructions:  Your physician recommends that you continue on your current medications as directed. Please refer to the Current Medication list given to you today.  *If you need a refill on your cardiac medications before your next appointment, please call your pharmacy*   Lab Work: Your physician recommends that you return for lab work in: bmet/cbc/tsh/lft/lipid  If you have labs (blood work) drawn today and your tests are completely normal, you will receive your results only by: Marland Kitchen MyChart Message (if you have MyChart) OR . A paper copy in the mail If you have any lab test that is abnormal or we need to change your treatment, we will call you to review the results.   Testing/Procedures: None   Follow-Up: At [facility], you and your health needs are our priority.  As part of our continuing mission to provide you with exceptional heart care, we have created designated Provider Care Teams.  These Care Teams include your primary Cardiologist (physician) and Advanced Practice Providers (APPs -  Physician Assistants and Nurse Practitioners) who all work together to provide you with the care you need, when you need it.  We recommend signing up for the patient portal called "MyChart".  Sign up information is provided on this After Visit Summary.  MyChart is used to connect with patients for Virtual Visits (Telemedicine).  Patients are able to view lab/test results, encounter notes, upcoming appointments, etc.  Non-urgent messages can be sent to your provider as well.   To learn more about what you can do with MyChart, go to [email or web address].    Your next appointment:   6 month(s)  The format for your next appointment:   In Person  Provider:   Belva Crome, MD On Tuesday March 8 @ 11:00 am.    Other Instructions

## 2020-05-05 ENCOUNTER — Telehealth: Payer: Self-pay

## 2020-05-05 DIAGNOSIS — I2510 Atherosclerotic heart disease of native coronary artery without angina pectoris: Secondary | ICD-10-CM

## 2020-05-05 LAB — LIPID PANEL
Chol/HDL Ratio: 4.6 ratio — ABNORMAL HIGH (ref 0.0–4.4)
Cholesterol, Total: 192 mg/dL (ref 100–199)
HDL: 42 mg/dL (ref >39)
LDL Chol Calc (NIH): 101 mg/dL — ABNORMAL HIGH (ref 0–99)
Triglycerides: 286 mg/dL — ABNORMAL HIGH (ref 0–149)
VLDL Cholesterol Cal: 49 mg/dL — ABNORMAL HIGH (ref 5–40)

## 2020-05-05 LAB — BASIC METABOLIC PANEL
BUN/Creatinine Ratio: 20 (ref 12–28)
BUN: 30 mg/dL — ABNORMAL HIGH (ref 8–27)
CO2: 21 mmol/L (ref 20–29)
Calcium: 9.6 mg/dL (ref 8.7–10.3)
Chloride: 107 mmol/L — ABNORMAL HIGH (ref 96–106)
Creatinine, Ser: 1.52 mg/dL — ABNORMAL HIGH (ref 0.57–1.00)
GFR calc Af Amer: 37 mL/min/{1.73_m2} — ABNORMAL LOW (ref >59)
GFR calc non Af Amer: 32 mL/min/{1.73_m2} — ABNORMAL LOW (ref >59)
Glucose: 132 mg/dL — ABNORMAL HIGH (ref 65–99)
Potassium: 4.4 mmol/L (ref 3.5–5.2)
Sodium: 141 mmol/L (ref 134–144)

## 2020-05-05 LAB — CBC WITH DIFFERENTIAL/PLATELET
Basophils Absolute: 0 10*3/uL (ref 0.0–0.2)
Basos: 0 %
EOS (ABSOLUTE): 0.3 10*3/uL (ref 0.0–0.4)
Eos: 5 %
Hematocrit: 33.9 % — ABNORMAL LOW (ref 34.0–46.6)
Hemoglobin: 11.4 g/dL (ref 11.1–15.9)
Immature Grans (Abs): 0 10*3/uL (ref 0.0–0.1)
Immature Granulocytes: 0 %
Lymphocytes Absolute: 1.5 10*3/uL (ref 0.7–3.1)
Lymphs: 25 %
MCH: 30.2 pg (ref 26.6–33.0)
MCHC: 33.6 g/dL (ref 31.5–35.7)
MCV: 90 fL (ref 79–97)
Monocytes Absolute: 0.6 10*3/uL (ref 0.1–0.9)
Monocytes: 10 %
Neutrophils Absolute: 3.7 10*3/uL (ref 1.4–7.0)
Neutrophils: 60 %
Platelets: 220 10*3/uL (ref 150–450)
RBC: 3.78 x10E6/uL (ref 3.77–5.28)
RDW: 12.9 % (ref 11.7–15.4)
WBC: 6.2 10*3/uL (ref 3.4–10.8)

## 2020-05-05 LAB — HEPATIC FUNCTION PANEL
ALT: 24 IU/L (ref 0–32)
AST: 15 IU/L (ref 0–40)
Albumin: 4.5 g/dL (ref 3.6–4.6)
Alkaline Phosphatase: 66 IU/L (ref 48–121)
Bilirubin Total: 0.2 mg/dL (ref 0.0–1.2)
Bilirubin, Direct: 0.08 mg/dL (ref 0.00–0.40)
Total Protein: 6.9 g/dL (ref 6.0–8.5)

## 2020-05-05 LAB — TSH: TSH: 1.33 u[IU]/mL (ref 0.450–4.500)

## 2020-05-05 NOTE — Telephone Encounter (Signed)
Spoke with patient regarding results and recommendation.  Patient verbalizes understanding and is agreeable to plan of care. Advised patient to call back with any issues or concerns.  

## 2020-05-05 NOTE — Telephone Encounter (Signed)
-----   Message from Garwin Brothers, MD sent at 05/05/2020  8:10 AM EDT ----- Lipids are markedly elevated.  She needs to diet better low-carb low-fat.  Exercise regularly and keep herself hydrated.  Recheck in 2 months.  Copy primary care Garwin Brothers, MD 05/05/2020 8:10 AM

## 2020-06-08 DIAGNOSIS — N200 Calculus of kidney: Secondary | ICD-10-CM | POA: Insufficient documentation

## 2020-07-18 ENCOUNTER — Other Ambulatory Visit: Payer: Self-pay

## 2020-07-18 ENCOUNTER — Ambulatory Visit: Payer: Medicare HMO | Admitting: Cardiology

## 2020-07-18 ENCOUNTER — Telehealth: Payer: Self-pay | Admitting: Cardiology

## 2020-07-18 VITALS — BP 130/70 | HR 68 | Ht 62.0 in | Wt 137.0 lb

## 2020-07-18 DIAGNOSIS — I10 Essential (primary) hypertension: Secondary | ICD-10-CM

## 2020-07-18 DIAGNOSIS — Z95828 Presence of other vascular implants and grafts: Secondary | ICD-10-CM | POA: Diagnosis not present

## 2020-07-18 DIAGNOSIS — Z955 Presence of coronary angioplasty implant and graft: Secondary | ICD-10-CM | POA: Insufficient documentation

## 2020-07-18 DIAGNOSIS — I209 Angina pectoris, unspecified: Secondary | ICD-10-CM | POA: Diagnosis not present

## 2020-07-18 DIAGNOSIS — N3281 Overactive bladder: Secondary | ICD-10-CM | POA: Insufficient documentation

## 2020-07-18 DIAGNOSIS — M1990 Unspecified osteoarthritis, unspecified site: Secondary | ICD-10-CM | POA: Insufficient documentation

## 2020-07-18 DIAGNOSIS — I2510 Atherosclerotic heart disease of native coronary artery without angina pectoris: Secondary | ICD-10-CM

## 2020-07-18 DIAGNOSIS — E7800 Pure hypercholesterolemia, unspecified: Secondary | ICD-10-CM | POA: Diagnosis not present

## 2020-07-18 DIAGNOSIS — N183 Chronic kidney disease, stage 3 unspecified: Secondary | ICD-10-CM

## 2020-07-18 DIAGNOSIS — R0600 Dyspnea, unspecified: Secondary | ICD-10-CM

## 2020-07-18 DIAGNOSIS — J449 Chronic obstructive pulmonary disease, unspecified: Secondary | ICD-10-CM | POA: Insufficient documentation

## 2020-07-18 DIAGNOSIS — F32A Depression, unspecified: Secondary | ICD-10-CM | POA: Insufficient documentation

## 2020-07-18 DIAGNOSIS — R0609 Other forms of dyspnea: Secondary | ICD-10-CM

## 2020-07-18 MED ORDER — NITROGLYCERIN 0.4 MG SL SUBL: 0.4000 mg | SUBLINGUAL_TABLET | SUBLINGUAL | 11 refills | Status: AC | PRN

## 2020-07-18 NOTE — Addendum Note (Signed)
Addended by: Verlyn Lambert, Elmarie Shiley L on: 07/18/2020 04:14 PM   Modules accepted: Orders

## 2020-07-18 NOTE — Telephone Encounter (Signed)
Pt called in this morning c/o sob with exertion only... she says it has been worsening over the past 2-3 days. She says she is comfortable at rest.   She denies fever and no pain with breathing.. she does have a cough with clear sputum production.   Pt denies peripheral edema and palpitations.   Pt seeing Dr. Tomie China at 3pm today but I also advised her to call her PCP.   Will forward to Dr. Rolanda Lundborg for review.

## 2020-07-18 NOTE — Patient Instructions (Signed)
Medication Instructions:  Your physician has recommended you make the following change in your medication:   Take nitroglycerin as needed for chest pain.  *If you need a refill on your cardiac medications before your next appointment, please call your pharmacy*   Lab Work: Your physician recommends that you have labs done in the office today. Your test included  basic metabolic panel and complete blood count.   If you have labs (blood work) drawn today and your tests are completely normal, you will receive your results only by: Marland Kitchen MyChart Message (if you have MyChart) OR . A paper copy in the mail If you have any lab test that is abnormal or we need to change your treatment, we will call you to review the results.   Testing/Procedures:    [facility]ARDIOVASCULAR DIVISION [facility] AT [place] [address] [place] [address] Dept: [phone] Loc: [phone]  Kristi Olson  07/18/2020  You are scheduled for a Cardiac Catheterization on Friday, November 26 with Dr. Lance Muss.  1. Please arrive at the [facility] (Main Entrance A) at [facility]: [address] [place], [place] [address] at 7:00 AM (This time is two hours before your procedure to ensure your preparation). Free valet parking service is available.   Special note: Every effort is made to have your procedure done on time. Please understand that emergencies sometimes delay scheduled procedures.  2. Diet: Do not eat solid foods after midnight.  The patient may have clear liquids until 5am upon the day of the procedure.  3. Labs: You had your labs completed today for your cath on Friday.  4. Medication instructions in preparation for your procedure:   Contrast Allergy: No  On the morning of your procedure, take your Aspirin and any morning medicines NOT listed above.  You may use sips of water.  5. Plan for one night stay--bring personal  belongings. 6. Bring a current list of your medications and current insurance cards. 7. You MUST have a responsible person to drive you home. 8. Someone MUST be with you the first 24 hours after you arrive home or your discharge will be delayed. 9. Please wear clothes that are easy to get on and off and wear slip-on shoes.  Thank you for allowing [place] to care for you!   -- [facility]    Follow-Up: At [facility], you and your health needs are our priority.  As part of our continuing mission to provide you with exceptional heart care, we have created designated Provider Care Teams.  These Care Teams include your primary Cardiologist (physician) and Advanced Practice Providers (APPs -  Physician Assistants and Nurse Practitioners) who all work together to provide you with the care you need, when you need it.  We recommend signing up for the patient portal called "MyChart".  Sign up information is provided on this After Visit Summary.  MyChart is used to connect with patients for Virtual Visits (Telemedicine).  Patients are able to view lab/test results, encounter notes, upcoming appointments, etc.  Non-urgent messages can be sent to your provider as well.   To le arn more about what you can do with MyChart, go to [email or web address].    Your next appointment:   1 month(s)  The format for your next appointment:   In Person  Provider:   Belva Crome, MD   Other Instructions  Coronary Angioplasty  Coronary angioplasty is a procedure to  widen a narrowed or blocked blood vessel of the heart (coronary artery). The artery is usually blocked by cholesterol buildup (plaques) in the lining of the artery walls. When a vessel in the heart becomes partially blocked, there is decreased blood flow to that area. This may lead to chest pain or a heart attack (myocardial infarction). Tell a health care provider about:  Any allergies you have, including allergies  to shellfish or contrast dye.  All medicines you are taking, including vitamins, herbs, eye drops, creams, and over-the-counter medicines.  Any problems you or family members have had with anesthetic medicines.  Any blood disorders you have.  Any surgeries you have had.  Any medical conditions you have.  Whether you are pregnant or may be pregnant. What are the risks? Generally, this is a safe procedure. However, problems may occur, including:  Damage to other structures or organs. This may include damage to blood vessels, leading to rupture or bleeding.  Infection, bleeding, or bruising at the site where a small, thin tube (catheter) will be inserted.  Allergic reaction to the dye or contrast that is used.  Kidney damage from the dye or contrast that is used.  Blood clots that can lead to a stroke or heart attack.  Bleeding into the abdomen (retroperitoneal bleeding). What happens before the procedure? Staying hydrated Follow instructions from your health care provider about hydration, which may include:  Up to 2 hours before the procedure - you may continue to drink clear liquids, such as water, clear fruit juice, black coffee, and plain tea. Eating and drinking restrictions Follow instructions from your health care provider about eating and drinking, which may include:  8 hours before the procedure - stop eating heavy meals or foods such as meat, fried foods, or fatty foods.  6 hours before the procedure - stop eating light meals or foods, such as toast or cereal.  2 hours before the procedure - stop drinking clear liquids. Medicines  Ask your health care provider about: ? Changing or stopping your regular medicines. This is especially important if you are taking diabetes medicines or blood thinners. ? Whether aspirin is recommended before this procedure.  Ask your health care provider if you can take a sip of water with any approved medicines the morning of the  procedure. General instructions  Plan to have someone take you home from the hospital or clinic.  If you will be going home right after the procedure, plan to have someone with you for 24 hours. What happens during the procedure?  To reduce your risk of infection: ? Your health care team will wash or sanitize their hands. ? A germ-killing solution (antiseptic) will be used to wash the area where the catheter will be inserted. Hair may be removed from this area. The catheter may be inserted in:  Your groin area. This is the most common area.  The fold of your arm, near your elbow.  Your wrist.  An IV tube will be inserted into one of your veins.  You will be given a medicine to help you relax (sedative).  You will be given a medicine to numb the area where the catheter will be inserted (local anesthetic).  The catheter will be inserted into an artery.  The catheter will be guided to the narrowed or blocked artery using a type of X-ray (fluoroscopy).  When the catheter is near the heart, dye will be injected that makes the narrowing or blockage visible on the X-ray.  Once the catheter is positioned at the narrowed or blocked portion of the blood vessel, a balloon will be inflated to make the artery wider. Expanding the balloon will crush the plaques into the wall of the vessel and improve the blood flow.  The artery may be made wider by removing plaques using a drill, laser, or other tools.  When the blood flow is better, the balloon will be deflated and the catheter will be removed.  A stent may be placed. This is common in this procedure.  After the catheter is removed, a special dressing will be placed over the insertion site. What happens after the procedure?  You will need to keep the area still for a few hours, or as long as directed by your health care provider. If the procedure was done in the groin, you will be instructed not to bend or cross your legs.  The  insertion site will be checked often.  The pulse in your feet or wrist will be checked often.  Additional blood tests, X-rays, and an electrocardiogram (ECG) may be done.  Do not drive for 24 hours if you were given a sedative. This information is not intended to replace advice given to you by your health care provider. Make sure you discuss any questions you have with your health care provider. Document Revised: 07/26/2017 Document Reviewed: 05/11/2016 Elsevier Patient Education  2020 [employer].

## 2020-07-18 NOTE — Progress Notes (Signed)
Cardiology Office Note:    Date:  07/18/2020   ID:  Kristi Olson, DOB May 13, 1936, MRN [id number]  PCP:  Raynelle Jan., MD  Cardiologist:  Garwin Brothers, MD   Referring MD: Raynelle Jan., MD    ASSESSMENT:    1. Benign essential HTN   2. Coronary artery disease involving native coronary artery of native heart without angina pectoris   3. Hypercholesteremia   4. H/O endovascular stent graft for abdominal aortic aneurysm   5. Stage 3 chronic kidney disease, unspecified whether stage 3a or 3b CKD (HCC)   6. DOE (dyspnea on exertion)   7. Angina pectoris (HCC)    PLAN:    In order of problems listed above:  1. Dyspnea on exertion: Anginal equivalent: Angina pectoris: Patient's symptoms are significant.  She has multiple risk factors for coronary artery disease and gives history of coronary artery disease.  She has documented peripheral vascular disease.  In view of this I discussed with her invasive and noninvasive evaluation.  She prefers invasive evaluation.  She understands she has renal risks in view of renal insufficiency.  I discussed the following with her at length.I discussed coronary angiography and left heart catheterization with the patient at extensive length. Procedure, benefits and potential risks were explained. Patient had multiple questions which were answered to the patient's satisfaction. Patient agreed and consented for the procedure. Further recommendations will be made based on the findings of the coronary angiography. In the interim. The patient has any significant symptoms he knows to go to the nearest emergency room.  I am not sure whether the patient would benefit much from left ventriculography.  We might skip it in order to reduce dye burden on her kidneys.  But I will leave this to the final judgment of my interventional colleague. 2. Essential hypertension: Blood pressure stable and diet was emphasized. 3. Mixed dyslipidemia: Diet was emphasized.   Patient is on statin therapy. 4. Renal insufficiency: As mentioned above risks of procedure explained.  Adequate hydration was urged.  She would feel better following her renal protocol with our Cath Lab. 5. She will be seen in follow-up appointment after coronary angiography.   Medication Adjustments/Labs and Tests Ordered: Current medicines are reviewed at length with the patient today.  Concerns regarding medicines are outlined above.  No orders of the defined types were placed in this encounter.  No orders of the defined types were placed in this encounter.    No chief complaint on file.    History of Present Illness:    Kristi Olson is a 84 y.o. female.  Patient has past medical history of coronary artery disease post coronary stenting in the remote past according to the history provided by the patient.  Essential hypertension, dyslipidemia, repair of abdominal aortic aneurysm by stenting.  She mentions to me that in the past week or so she has noted shortness of breath and chest tightness on exertion.  This is new for her.  No orthopnea or PND.  When she rests she feels better.  She is concerned about it and wanted to be evaluated and made a quick appointment with me.  At the time of my evaluation, the patient is alert awake oriented and in no distress.  Past Medical History:  Diagnosis Date  . Abdominal aortic aneurysm (AAA) without rupture (HCC) 10/06/2015  . Acid reflux 10/06/2015  . Anemia, iron deficiency 10/06/2015  . Arthritis   . Benign essential HTN 10/06/2015  .  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 CAD (coronary artery disease) 04/24/2018  . CAD in native artery 10/06/2015  . Carotid artery bruit 10/06/2015  . Cephalalgia 11/18/2013  . Cervical osteoarthritis 06/22/2013  . COPD (chronic obstructive pulmonary disease) (HCC)   . Degenerative arthritis of thoracic spine 11/18/2013   Overview:  IMPRESSION: T8-9   . Depression   . Dyslipidemia 10/25/2015  . H/O endovascular  stent graft for abdominal aortic aneurysm 10/06/2015  . History of anemia 10/06/2015  . Hx of heart artery stent   . Hypercholesteremia   . Hypertension   . Intermittent claudication (HCC) 10/06/2015  . L-S radiculopathy 01/01/2014  . Overactive bladder   . Pulmonary emphysema (HCC) 10/06/2015  . Recurrent major depressive disorder, in full remission (HCC) 03/31/2017  . Thoracic and lumbosacral neuritis 11/18/2013   Overview:  IMPRESSION: right L5     Past Surgical History:  Procedure Laterality Date  . ABDOMINAL AORTIC ANEURYSM REPAIR    . ABDOMINAL HYSTERECTOMY    . APPENDECTOMY    . BREAST EXCISIONAL BIOPSY    . BREAST SURGERY    . CHOLECYSTECTOMY    . FOOT SURGERY Left   . NECK SURGERY    . TONSILLECTOMY      Current Medications: Current Meds  Medication Sig  . aspirin EC 81 MG tablet Take 162 mg by mouth daily. Frequency:   Dosage:0   MG  Instructions:  Note:1 tablet po daily.  Marland Kitchen atorvastatin (LIPITOR) 80 MG tablet Take 1 tablet by mouth once daily  . Calcium Carb-Cholecalciferol (CALCIUM 1000 + D) 1000-800 MG-UNIT TABS Take 2 tablets by mouth daily.  . fenofibrate (TRICOR) 145 MG tablet Take 1 tablet (145 mg total) by mouth daily.  . metoprolol succinate (TOPROL-XL) 50 MG 24 hr tablet Take 1 tablet by mouth once daily  . nitroGLYCERIN (NITROSTAT) 0.4 MG SL tablet Place 1 tablet (0.4 mg total) under the tongue as needed for chest pain.  Marland Kitchen Omeprazole Magnesium 20.6 (20 Base) MG CPDR Take 1 capsule by mouth daily.   . potassium chloride SA (KLOR-CON) 20 MEQ tablet Take 20 mEq by mouth daily.     Allergies:   Amoxicillin and Sulfa antibiotics   Social History   Socioeconomic History  . Marital status: Married    Spouse name: Not on file  . Number of children: Not on file  . Years of education: Not on file  . Highest education level: Not on file  Occupational History  . Not on file  Tobacco Use  . Smoking status: Former Smoker    Packs/day: 1.00    Years: 40.00    Pack  years: 40.00    Types: Cigarettes    Quit date: 12/13/2005    Years since quitting: 14.6  . Smokeless tobacco: Never Used  Substance and Sexual Activity  . Alcohol use: No    Alcohol/week: 0.0 standard drinks  . Drug use: No  . Sexual activity: Not on file  Other Topics Concern  . Not on file  Social History Narrative  . Not on file   Social Determinants of Health   Financial Resource Strain:   . Difficulty of Paying Living Expenses: Not on file  Food Insecurity:   . Worried About Programme researcher, broadcasting/film/video in the Last Year: Not on file  . Ran Out of Food in the Last Year: Not on file  Transportation Needs:   . Lack of Transportation (Medical): Not on file  . Lack of Transportation (Non-Medical): Not  on file  Physical Activity:   . Days of Exercise per Week: Not on file  . Minutes of Exercise per Session: Not on file  Stress:   . Feeling of Stress : Not on file  Social Connections:   . Frequency of Communication with Friends and Family: Not on file  . Frequency of Social Gatherings with Friends and Family: Not on file  . Attends Religious Services: Not on file  . Active Member of Clubs or Organizations: Not on file  . Attends Banker Meetings: Not on file  . Marital Status: Not on file     Family History: The patient's family history includes Breast cancer in her sister.  ROS:   Please see the history of present illness.    All other systems reviewed and are negative.  EKGs/Labs/Other Studies Reviewed:    The following studies were reviewed today: EKG reveals sinus rhythm and nonspecific ST-T changes   Recent Labs: 05/04/2020: ALT 24; BUN 30; Creatinine, Ser 1.52; Hemoglobin 11.4; Platelets 220; Potassium 4.4; Sodium 141; TSH 1.330  Recent Lipid Panel    Component Value Date/Time   CHOL 192 05/04/2020 1106   TRIG 286 (H) 05/04/2020 1106   HDL 42 05/04/2020 1106   CHOLHDL 4.6 (H) 05/04/2020 1106   LDLCALC 101 (H) 05/04/2020 1106    Physical Exam:     VS:  BP 130/70   Pulse 68   Ht 5\' 2"  (1.575 m)   Wt 137 lb (62.1 kg)   SpO2 94%   BMI 25.06 kg/m     Wt Readings from Last 3 Encounters:  07/18/20 137 lb (62.1 kg)  05/04/20 142 lb 9.6 oz (64.7 kg)  10/30/19 144 lb (65.3 kg)     GEN: Patient is in no acute distress HEENT: Normal NECK: No JVD; No carotid bruits LYMPHATICS: No lymphadenopathy CARDIAC: Hear sounds regular, 2/6 systolic murmur at the apex. RESPIRATORY:  Clear to auscultation without rales, wheezing or rhonchi  ABDOMEN: Soft, non-tender, non-distended MUSCULOSKELETAL:  No edema; No deformity  SKIN: Warm and dry NEUROLOGIC:  Alert and oriented x 3 PSYCHIATRIC:  Normal affect   Signed, 12/30/19, MD  07/18/2020 3:33 PM    [facility]

## 2020-07-18 NOTE — Telephone Encounter (Signed)
Pt c/o Shortness Of Breath: STAT if SOB developed within the last 24 hours or pt is noticeably SOB on the phone  1. Are you currently SOB (can you hear that pt is SOB on the phone)? Yes, cannot hear otp   2. How long have you been experiencing SOB? Past 2-3 days   3. Are you SOB when sitting or when up moving around? Moving around   4. Are you currently experiencing any other symptoms? No   Pt is scheduled for an appt today, 07/18/20 at 3:00 PM in regards to this.

## 2020-07-19 ENCOUNTER — Telehealth: Payer: Self-pay | Admitting: *Deleted

## 2020-07-19 LAB — CBC WITH DIFFERENTIAL/PLATELET
Basophils Absolute: 0 10*3/uL (ref 0.0–0.2)
Basos: 0 %
EOS (ABSOLUTE): 0.2 10*3/uL (ref 0.0–0.4)
Eos: 2 %
Hematocrit: 34.8 % (ref 34.0–46.6)
Hemoglobin: 11.7 g/dL (ref 11.1–15.9)
Immature Grans (Abs): 0 10*3/uL (ref 0.0–0.1)
Immature Granulocytes: 0 %
Lymphocytes Absolute: 2 10*3/uL (ref 0.7–3.1)
Lymphs: 25 %
MCH: 29.4 pg (ref 26.6–33.0)
MCHC: 33.6 g/dL (ref 31.5–35.7)
MCV: 87 fL (ref 79–97)
Monocytes Absolute: 0.8 10*3/uL (ref 0.1–0.9)
Monocytes: 10 %
Neutrophils Absolute: 4.8 10*3/uL (ref 1.4–7.0)
Neutrophils: 63 %
Platelets: 223 10*3/uL (ref 150–450)
RBC: 3.98 x10E6/uL (ref 3.77–5.28)
RDW: 13.8 % (ref 11.7–15.4)
WBC: 7.8 10*3/uL (ref 3.4–10.8)

## 2020-07-19 LAB — BASIC METABOLIC PANEL
BUN/Creatinine Ratio: 16 (ref 12–28)
BUN: 28 mg/dL — ABNORMAL HIGH (ref 8–27)
CO2: 22 mmol/L (ref 20–29)
Calcium: 9.6 mg/dL (ref 8.7–10.3)
Chloride: 100 mmol/L (ref 96–106)
Creatinine, Ser: 1.73 mg/dL — ABNORMAL HIGH (ref 0.57–1.00)
GFR calc Af Amer: 31 mL/min/{1.73_m2} — ABNORMAL LOW (ref >59)
GFR calc non Af Amer: 27 mL/min/{1.73_m2} — ABNORMAL LOW (ref >59)
Glucose: 139 mg/dL — ABNORMAL HIGH (ref 65–99)
Potassium: 4.1 mmol/L (ref 3.5–5.2)
Sodium: 140 mmol/L (ref 134–144)

## 2020-07-19 NOTE — Telephone Encounter (Addendum)
Pt contacted pre-catheterization scheduled at [facility] for: Friday November 26,2021 12 Noon Verified arrival time and place: [facility] Main Entrance A [facility]) at: 7 AM-pre-procedure hydration   No solid food after midnight prior to cath, clear liquids until 5 AM day of procedure.  AM meds can be  taken pre-cath with sips of water including: ASA 81 mg   Confirmed patient has responsible adult to drive home post procedure and be with patient first 24 hours after arriving home: yes  You are allowed ONE visitor in the waiting room during the time you are at the hospital for your procedure. Both you and your visitor must wear a mask once you enter the hospital.       COVID-19 Pre-Screening Questions:  . In the past 14 days have you had any symptoms concerning for COVID-19 infection (fever, chills, cough, or new shortness of breath)? no . In the past 14 days have you been around anyone with known Covid 19? No   Reviewed procedure/mask/visitor instructions, COVID-19 questions, pre-procedure hydration with patient.  Pre-procedure COVID-19 test Jul 21, 2020, provided address for test location, and discussed with patient need to quarantine at home after test until going to hospital for procedure 07/22/20.

## 2020-07-20 ENCOUNTER — Telehealth: Payer: Self-pay | Admitting: *Deleted

## 2020-07-20 ENCOUNTER — Inpatient Hospital Stay (HOSPITAL_COMMUNITY): Admission: RE | Admit: 2020-07-20 | Payer: Medicare HMO | Source: Ambulatory Visit

## 2020-07-20 NOTE — Telephone Encounter (Signed)
It did not look like patient had pre-procedure COVID-19 test this morning, call placed to patient to discuss. Patient's home number was answered by patient's step-daughter, Barth Kirks. Barth Kirks states patient passed away earlier today and pt's husband (her father) was at the funeral home. Barth Kirks reports patient was getting ready to go to have COVID-19 test done this morning and she started having hard time breathing. Barth Kirks reports patient's husband called 911 and patient was taken to [facility] by ambulance. I offered my condolences to Barth Kirks and her family.  Teri aware that I will forward this note to Dr Tomie China.

## 2020-07-22 ENCOUNTER — Ambulatory Visit (HOSPITAL_COMMUNITY)
Admission: RE | Admit: 2020-07-22 | Payer: Medicare HMO | Source: Home / Self Care | Admitting: Interventional Cardiology

## 2020-07-22 SURGERY — LEFT HEART CATH AND CORONARY ANGIOGRAPHY
Anesthesia: LOCAL

## 2020-07-27 DEATH — deceased

## 2020-08-23 ENCOUNTER — Ambulatory Visit: Payer: Medicare HMO | Admitting: Cardiology

## 2020-11-01 ENCOUNTER — Ambulatory Visit: Payer: Medicare HMO | Admitting: Cardiology
